# Patient Record
Sex: Female | Born: 1945 | Race: Black or African American | Hispanic: No | Marital: Single | Smoking: Former smoker
Health system: Southern US, Community
[De-identification: ages and names within clinical notes are randomized; demographics above are authoritative.]

## PROBLEM LIST (undated history)

## (undated) DIAGNOSIS — E876 Hypokalemia: Secondary | ICD-10-CM

## (undated) DIAGNOSIS — R519 Headache, unspecified: Secondary | ICD-10-CM

## (undated) DIAGNOSIS — R51 Headache: Secondary | ICD-10-CM

## (undated) DIAGNOSIS — Z22322 Carrier or suspected carrier of Methicillin resistant Staphylococcus aureus: Secondary | ICD-10-CM

## (undated) DIAGNOSIS — M8580 Other specified disorders of bone density and structure, unspecified site: Secondary | ICD-10-CM

## (undated) DIAGNOSIS — K449 Diaphragmatic hernia without obstruction or gangrene: Secondary | ICD-10-CM

## (undated) DIAGNOSIS — Z5189 Encounter for other specified aftercare: Secondary | ICD-10-CM

## (undated) DIAGNOSIS — K219 Gastro-esophageal reflux disease without esophagitis: Secondary | ICD-10-CM

## (undated) DIAGNOSIS — K635 Polyp of colon: Secondary | ICD-10-CM

## (undated) DIAGNOSIS — H539 Unspecified visual disturbance: Secondary | ICD-10-CM

## (undated) HISTORY — DX: Headache: R51

## (undated) HISTORY — DX: Unspecified visual disturbance: H53.9

## (undated) HISTORY — DX: Other specified disorders of bone density and structure, unspecified site: M85.80

## (undated) HISTORY — DX: Diaphragmatic hernia without obstruction or gangrene: K44.9

## (undated) HISTORY — PX: CATARACT EXTRACTION, BILATERAL: SHX1313

## (undated) HISTORY — DX: Hypokalemia: E87.6

## (undated) HISTORY — DX: Gastro-esophageal reflux disease without esophagitis: K21.9

## (undated) HISTORY — DX: Carrier or suspected carrier of methicillin resistant Staphylococcus aureus: Z22.322

## (undated) HISTORY — PX: OTHER SURGICAL HISTORY: SHX169

## (undated) HISTORY — DX: Polyp of colon: K63.5

## (undated) HISTORY — PX: COLONOSCOPY: SHX174

## (undated) HISTORY — DX: Encounter for other specified aftercare: Z51.89

## (undated) HISTORY — DX: Headache, unspecified: R51.9

---

## 1998-12-08 ENCOUNTER — Ambulatory Visit (HOSPITAL_COMMUNITY): Admission: RE | Admit: 1998-12-08 | Discharge: 1998-12-08 | Payer: Self-pay | Admitting: Gastroenterology

## 1999-04-25 ENCOUNTER — Other Ambulatory Visit: Admission: RE | Admit: 1999-04-25 | Discharge: 1999-04-25 | Payer: Self-pay | Admitting: *Deleted

## 2000-06-11 ENCOUNTER — Other Ambulatory Visit: Admission: RE | Admit: 2000-06-11 | Discharge: 2000-06-11 | Payer: Self-pay | Admitting: *Deleted

## 2000-11-16 ENCOUNTER — Encounter: Payer: Self-pay | Admitting: *Deleted

## 2000-11-16 ENCOUNTER — Encounter: Admission: RE | Admit: 2000-11-16 | Discharge: 2000-11-16 | Payer: Self-pay | Admitting: *Deleted

## 2001-08-21 ENCOUNTER — Other Ambulatory Visit: Admission: RE | Admit: 2001-08-21 | Discharge: 2001-08-21 | Payer: Self-pay | Admitting: *Deleted

## 2002-07-09 ENCOUNTER — Ambulatory Visit (HOSPITAL_COMMUNITY): Admission: RE | Admit: 2002-07-09 | Discharge: 2002-07-09 | Payer: Self-pay | Admitting: *Deleted

## 2002-07-09 ENCOUNTER — Encounter: Payer: Self-pay | Admitting: *Deleted

## 2002-09-29 ENCOUNTER — Other Ambulatory Visit: Admission: RE | Admit: 2002-09-29 | Discharge: 2002-09-29 | Payer: Self-pay | Admitting: *Deleted

## 2003-10-16 ENCOUNTER — Ambulatory Visit (HOSPITAL_COMMUNITY): Admission: RE | Admit: 2003-10-16 | Discharge: 2003-10-16 | Payer: Self-pay | Admitting: *Deleted

## 2003-11-05 ENCOUNTER — Other Ambulatory Visit: Admission: RE | Admit: 2003-11-05 | Discharge: 2003-11-05 | Payer: Self-pay | Admitting: *Deleted

## 2004-10-17 ENCOUNTER — Ambulatory Visit (HOSPITAL_COMMUNITY): Admission: RE | Admit: 2004-10-17 | Discharge: 2004-10-17 | Payer: Self-pay | Admitting: Obstetrics and Gynecology

## 2004-11-09 ENCOUNTER — Other Ambulatory Visit: Admission: RE | Admit: 2004-11-09 | Discharge: 2004-11-09 | Payer: Self-pay | Admitting: Obstetrics and Gynecology

## 2005-09-06 ENCOUNTER — Ambulatory Visit: Payer: Self-pay | Admitting: Internal Medicine

## 2005-09-23 ENCOUNTER — Emergency Department (HOSPITAL_COMMUNITY): Admission: EM | Admit: 2005-09-23 | Discharge: 2005-09-23 | Payer: Self-pay | Admitting: Family Medicine

## 2005-09-26 ENCOUNTER — Ambulatory Visit: Payer: Self-pay | Admitting: Internal Medicine

## 2006-01-08 ENCOUNTER — Ambulatory Visit (HOSPITAL_COMMUNITY): Admission: RE | Admit: 2006-01-08 | Discharge: 2006-01-08 | Payer: Self-pay | Admitting: Obstetrics and Gynecology

## 2006-01-18 ENCOUNTER — Other Ambulatory Visit: Admission: RE | Admit: 2006-01-18 | Discharge: 2006-01-18 | Payer: Self-pay | Admitting: Obstetrics and Gynecology

## 2006-09-14 ENCOUNTER — Emergency Department (HOSPITAL_COMMUNITY): Admission: EM | Admit: 2006-09-14 | Discharge: 2006-09-14 | Payer: Self-pay | Admitting: Emergency Medicine

## 2007-05-29 ENCOUNTER — Ambulatory Visit (HOSPITAL_COMMUNITY): Admission: RE | Admit: 2007-05-29 | Discharge: 2007-05-29 | Payer: Self-pay | Admitting: Obstetrics and Gynecology

## 2008-05-29 ENCOUNTER — Ambulatory Visit (HOSPITAL_COMMUNITY): Admission: RE | Admit: 2008-05-29 | Discharge: 2008-05-29 | Payer: Self-pay | Admitting: Obstetrics and Gynecology

## 2009-06-25 ENCOUNTER — Ambulatory Visit (HOSPITAL_COMMUNITY): Admission: RE | Admit: 2009-06-25 | Discharge: 2009-06-25 | Payer: Self-pay | Admitting: Obstetrics and Gynecology

## 2010-07-27 ENCOUNTER — Encounter (INDEPENDENT_AMBULATORY_CARE_PROVIDER_SITE_OTHER): Payer: Self-pay | Admitting: *Deleted

## 2010-10-07 ENCOUNTER — Ambulatory Visit (HOSPITAL_COMMUNITY)
Admission: RE | Admit: 2010-10-07 | Discharge: 2010-10-07 | Payer: Self-pay | Source: Home / Self Care | Admitting: Obstetrics and Gynecology

## 2010-11-18 ENCOUNTER — Encounter (INDEPENDENT_AMBULATORY_CARE_PROVIDER_SITE_OTHER): Payer: Self-pay | Admitting: *Deleted

## 2010-12-06 NOTE — Letter (Signed)
Summary: Colonoscopy Letter  [facility]  [address] [place], [place] [address]   Phone: [phone]  Fax: [phone]      July 27, 2010 MRN: [id number]   [name] [address] [place], [place]  [address]   Dear Ms. Suzanne Coleman,   According to your medical record, it is time for you to schedule a Colonoscopy. The American Cancer Society recommends this procedure as a method to detect early colon cancer. Patients with a family history of colon cancer, or a personal history of colon polyps or inflammatory bowel disease are at increased risk.  This letter has beeen generated based on the recommendations made at the time of your procedure. If you feel that in your particular situation this may no longer apply, please contact our office.  Please call our office at [phone] to schedule this appointment or to update your records at your earliest convenience.  Thank you for cooperating with [place] to provide you with the very best care possible.   Sincerely,  Hedwig Morton. Juanda Chance, M.D.  [facility] Gastroenterology Division [phone]

## 2010-12-08 NOTE — Letter (Signed)
Summary: Pre Visit Letter Revised  [facility]  [address] [place], [place] [address]   Phone: [phone]  Fax: [phone]        11/18/2010 MRN: [id number] Suzanne Coleman [address] [place], [place]  [address]             Procedure Date:  01/06/2011 @ 10:30   Recall colon-Dr. Juanda Chance   Welcome to the Gastroenterology Division at [facility].    You are scheduled to see a nurse for your pre-procedure visit on 12/23/2010 at 8:30am on the 3rd floor at [facility], [address]  We ask that you try to arrive at our office 15 minutes prior to your appointment time to allow for check-in.  Please take a minute to review the attached form.  If you answer "Yes" to one or more of the questions on the first page, we ask that you call the person listed at your earliest opportunity.  If you answer "No" to all of the questions, please complete the rest of the form and bring it to your appointment.    Your nurse visit will consist of discussing your medical and surgical history, your immediate family medical history, and your medications.   If you are unable to list all of your medications on the form, please bring the medication bottles to your appointment and we will list them.  We will need to be aware of both prescribed and over the counter drugs.  We will need to know exact dosage information as well.    Please be prepared to read and sign documents such as consent forms, a financial agreement, and acknowledgement forms.  If necessary, and with your consent, a friend or relative is welcome to sit-in on the nurse visit with you.  Please bring your insurance card so that we may make a copy of it.  If your insurance requires a referral to see a specialist, please bring your referral form from your primary care physician.  No co-pay is required for this nurse visit.     If you cannot keep your appointment, please call [phone] to cancel or reschedule prior to  your appointment date.  This allows [place] the opportunity to schedule an appointment for another patient in need of care.    Thank you for choosing [facility] for your medical needs.  We appreciate the opportunity to care for you.  Please visit [place] at our website  to learn more about our practice.  Sincerely, The Gastroenterology Division

## 2010-12-21 ENCOUNTER — Encounter (INDEPENDENT_AMBULATORY_CARE_PROVIDER_SITE_OTHER): Payer: Self-pay | Admitting: *Deleted

## 2010-12-23 ENCOUNTER — Encounter: Payer: Self-pay | Admitting: Internal Medicine

## 2010-12-28 NOTE — Miscellaneous (Signed)
Summary: [facility]  Clinical Lists Changes  Medications: Added new medication of MIRALAX   POWD (POLYETHYLENE GLYCOL 3350) As per prep  instructions. - Signed Added new medication of DULCOLAX 5 MG  TBEC (BISACODYL) Day before procedure take 2 at 3pm and 2 at 8pm. - Signed Added new medication of REGLAN 10 MG  TABS (METOCLOPRAMIDE HCL) As per prep instructions. - Signed Rx of MIRALAX   POWD (POLYETHYLENE GLYCOL 3350) As per prep  instructions.;  #255gm x 0;  Signed;  Entered by: Ezra Sites RN;  Authorized by: Hart Carwin MD;  Method used: [facility] [address] [phone]*, [address] [place], [place], [place]  [address], Ph: [phone] or [phone], Fax: [phone] Rx of DULCOLAX 5 MG  TBEC (BISACODYL) Day before procedure take 2 at 3pm and 2 at 8pm.;  #4 x 0;  Signed;  Entered by: Ezra Sites RN;  Authorized by: Hart Carwin MD;  Method used: [facility] [address] [phone]*, [address] [place], [place], [place]  [address], Ph: [phone] or [phone], Fax: [phone] Rx of REGLAN 10 MG  TABS (METOCLOPRAMIDE HCL) As per prep instructions.;  #2 x 0;  Signed;  Entered by: Ezra Sites RN;  Authorized by: Hart Carwin MD;  Method used: [facility] [address] [phone]*, [address], [place], [place], [place]  [address], Ph: [phone] or [phone], Fax: [phone] Allergies: Added new allergy or adverse reaction of CODEINE Added new allergy or adverse reaction of SULFA Observations: Added new observation of NKA: F (12/23/2010 8:43)    Prescriptions: REGLAN 10 MG  TABS (METOCLOPRAMIDE HCL) As per prep instructions.  #2 x 0   Entered by:   Ezra Sites RN   Authorized by:   Hart Carwin MD   Signed by:   Ezra Sites RN on 12/23/2010   Method used:   Electronically to        [facility] [address] [phone]* (retail)       [address]       [place], [place]  [address]       Ph:  [phone] or [phone]       Fax: [phone]   RxID:   [id number] DULCOLAX 5 MG  TBEC (BISACODYL) Day before procedure take 2 at 3pm and 2 at 8pm.  #4 x 0   Entered by:   Ezra Sites RN   Authorized by:   Hart Carwin MD   Signed by:   Ezra Sites RN on 12/23/2010   Method used:   Electronically to        [facility] [address] [phone]* (retail)       [address]       [place], [place]  [address]       Ph: [phone] or [phone]       Fax: [phone]   RxID:   [id number] MIRALAX   POWD (POLYETHYLENE GLYCOL 3350) As per prep  instructions.  #255gm x 0   Entered by:   Ezra Sites RN   Authorized by:   Hart Carwin MD   Signed by:   Ezra Sites RN on 12/23/2010   Method used:   Electronically to        [facility] [address] [phone]* (retail)       [address]  [place], [place]  [address]       Ph: [phone] or [phone]       Fax: [phone]   RxID:   [id number]

## 2010-12-28 NOTE — Letter (Signed)
Summary: [facility] Instructions  [facility]  [address] [place], [place] [address]   Phone: [phone]  Fax: [phone]       Suzanne Coleman    August 26, 1946    MRN: [id number]       Procedure Day /Date:  Friday 01/06/2011     Arrival Time: 9:30 am     Procedure Time: 10:30 am     Location of Procedure:                    _x _  [facility] (4th Floor)   PREPARATION FOR COLONOSCOPY WITH MIRALAX  Starting 5 days prior to your procedure Sunday 2/26 do not eat nuts, seeds, popcorn, corn, beans, peas,  salads, or any raw vegetables.  Do not take any fiber supplements (e.g. Metamucil, Citrucel, and Benefiber). ____________________________________________________________________________________________________   THE DAY BEFORE YOUR PROCEDURE         DATE: Thursday 3/1  1   Drink clear liquids the entire day-NO SOLID FOOD  2   Do not drink anything colored red or purple.  Avoid juices with pulp.  No orange juice.  3   Drink at least 64 oz. (8 glasses) of fluid/clear liquids during the day to prevent dehydration and help the prep work efficiently.  CLEAR LIQUIDS INCLUDE: Water Jello Ice Popsicles Tea (sugar ok, no milk/cream) Powdered fruit flavored drinks Coffee (sugar ok, no milk/cream) Gatorade Juice: apple, white grape, white cranberry  Lemonade Clear bullion, consomm, broth Carbonated beverages (any kind) Strained chicken noodle soup Hard Candy  4   Mix the entire bottle of Miralax with 64 oz. of Gatorade/Powerade in the morning and put in the refrigerator to chill.  5   At 3:00 pm take 2 Dulcolax/Bisacodyl tablets.  6   At 4:30 pm take one Reglan/Metoclopramide tablet.  7  Starting at 5:00 pm drink one 8 oz glass of the Miralax mixture every 15-20 minutes until you have finished drinking the entire 64 oz.  You should finish drinking prep around 7:30 or 8:00 pm.  8   If you are nauseated, you may take the 2nd Reglan/Metoclopramide tablet  at 6:30 pm.        9    At 8:00 pm take 2 more DULCOLAX/Bisacodyl tablets.     THE DAY OF YOUR PROCEDURE      DATE:  Friday 3/2 You may drink clear liquids until 8:30 am  (2 HOURS BEFORE PROCEDURE).   MEDICATION INSTRUCTIONS  Unless otherwise instructed, you should take regular prescription medications with a small sip of water as early as possible the morning of your procedure.        OTHER INSTRUCTIONS  You will need a responsible adult at least 63 years of age to accompany you and drive you home.   This person must remain in the waiting room during your procedure.  Wear loose fitting clothing that is easily removed.  Leave jewelry and other valuables at home.  However, you may wish to bring a book to read or an iPod/MP3 player to listen to music as you wait for your procedure to start.  Remove all body piercing jewelry and leave at home.  Total time from sign-in until discharge is approximately 2-3 hours.  You should go home directly after your procedure and rest.  You can resume normal activities the day after your procedure.  The day of your procedure you should not:   Drive   Make legal decisions  Operate machinery   Drink alcohol   Return to work  You will receive specific instructions about eating, activities and medications before you leave.   The above instructions have been reviewed and explained to me by   Ezra Sites RN  December 23, 2010 9:04 AM    I fully understand and can verbalize these instructions _____________________________ Date _______

## 2011-01-06 ENCOUNTER — Encounter: Payer: Self-pay | Admitting: Internal Medicine

## 2011-01-06 ENCOUNTER — Other Ambulatory Visit: Payer: 59 | Admitting: Internal Medicine

## 2011-01-12 NOTE — Procedures (Addendum)
Summary: Colonoscopy  Patient: Suzanne Coleman Note: All result statuses are Final unless otherwise noted.  Tests: (1) Colonoscopy (COL)   COL Colonoscopy           DONE     [facility]     [address]     [place], [place]  [address]          COLONOSCOPY PROCEDURE REPORT          PATIENT:  Suzanne, Coleman  MR#:  [id number]     BIRTHDATE:  1946-07-30, 64 yrs. old  GENDER:  female     ENDOSCOPIST:  Hedwig Morton. Juanda Chance, MD     REF. BY:  Harold Hedge, M.D.     PROCEDURE DATE:  01/06/2011     PROCEDURE:  Colonoscopy 62952     ASA CLASS:  Class I     INDICATIONS:  family history of colon cancer father with colon     cancer, personal hx of colon polyps ( no documentation)     Last colon 09/2005     MEDICATIONS:   Versed 8 mg, Fentanyl 75          DESCRIPTION OF PROCEDURE:   After the risks benefits and     alternatives of the procedure were thoroughly explained, informed     consent was obtained.  Digital rectal exam was performed and     revealed no rectal masses.   The LB [id number]-[id number] endoscope     was introduced through the anus and advanced to the cecum, which     was identified by both the appendix and ileocecal valve, without     limitations.  The quality of the prep was good, using MiraLax.     The instrument was then slowly withdrawn as the colon was fully     examined.     <<PROCEDUREIMAGES>>          FINDINGS:  No polyps or cancers were seen (see image1 and image3).     Retroflexed views in the rectum revealed no abnormalities.    The     scope was then withdrawn from the patient and the procedure     completed.          COMPLICATIONS:  None     ENDOSCOPIC IMPRESSION:     1) No polyps or cancers     2) Normal colonoscopy     RECOMMENDATIONS:     1) high fiber diet     REPEAT EXAM:  In 5 year(s) for.          ______________________________     Hedwig Morton. Juanda Chance, MD          CC:          n.     eSIGNED:   Hedwig Morton. Yena Tisby at 01/06/2011 11:32 AM        Jiles Harold, [phone]  Note: An exclamation mark (!) indicates a result that was not dispersed into the flowsheet. Document Creation Date: 01/06/2011 11:32 AM _______________________________________________________________________  (1) Order result status: Final Collection or observation date-time: 01/06/2011 11:21 Requested date-time:  Receipt date-time:  Reported date-time:  Referring Physician:   Ordering Physician: Lina Sar [phone]) Specimen Source:  Source: Launa Grill Order Number: [id number] Lab site:   Appended Document: Colonoscopy    Clinical Lists Changes  Observations: Added new observation of COLONNXTDUE: 01/2016 (01/06/2011 12:19)

## 2011-11-08 ENCOUNTER — Other Ambulatory Visit (HOSPITAL_COMMUNITY): Payer: Self-pay | Admitting: Obstetrics and Gynecology

## 2011-11-08 DIAGNOSIS — Z1231 Encounter for screening mammogram for malignant neoplasm of breast: Secondary | ICD-10-CM

## 2011-11-09 ENCOUNTER — Ambulatory Visit (HOSPITAL_COMMUNITY)
Admission: RE | Admit: 2011-11-09 | Discharge: 2011-11-09 | Disposition: A | Discharge status: Home / Self Care | Payer: 59 | Source: Ambulatory Visit | Attending: Obstetrics and Gynecology | Admitting: Obstetrics and Gynecology

## 2011-11-09 DIAGNOSIS — Z1231 Encounter for screening mammogram for malignant neoplasm of breast: Secondary | ICD-10-CM | POA: Insufficient documentation

## 2012-11-15 ENCOUNTER — Other Ambulatory Visit (HOSPITAL_COMMUNITY): Payer: Self-pay | Admitting: Obstetrics and Gynecology

## 2012-11-15 DIAGNOSIS — Z1231 Encounter for screening mammogram for malignant neoplasm of breast: Secondary | ICD-10-CM

## 2012-11-22 ENCOUNTER — Ambulatory Visit (HOSPITAL_COMMUNITY)
Admission: RE | Admit: 2012-11-22 | Discharge: 2012-11-22 | Disposition: A | Discharge status: Home / Self Care | Payer: 59 | Source: Ambulatory Visit | Attending: Obstetrics and Gynecology | Admitting: Obstetrics and Gynecology

## 2012-11-22 DIAGNOSIS — Z1231 Encounter for screening mammogram for malignant neoplasm of breast: Secondary | ICD-10-CM

## 2013-10-22 ENCOUNTER — Other Ambulatory Visit (HOSPITAL_COMMUNITY): Payer: Self-pay | Admitting: Obstetrics and Gynecology

## 2013-10-22 DIAGNOSIS — Z1231 Encounter for screening mammogram for malignant neoplasm of breast: Secondary | ICD-10-CM

## 2013-10-27 DIAGNOSIS — H2510 Age-related nuclear cataract, unspecified eye: Secondary | ICD-10-CM | POA: Diagnosis not present

## 2013-12-02 ENCOUNTER — Ambulatory Visit (HOSPITAL_COMMUNITY)
Admission: RE | Admit: 2013-12-02 | Discharge: 2013-12-02 | Disposition: A | Discharge status: Home / Self Care | Payer: Medicare Other | Source: Ambulatory Visit | Attending: Obstetrics and Gynecology | Admitting: Obstetrics and Gynecology

## 2013-12-02 ENCOUNTER — Ambulatory Visit (HOSPITAL_COMMUNITY): Payer: 59

## 2013-12-02 DIAGNOSIS — Z1231 Encounter for screening mammogram for malignant neoplasm of breast: Secondary | ICD-10-CM | POA: Insufficient documentation

## 2014-04-03 DIAGNOSIS — Z0000 Encounter for general adult medical examination without abnormal findings: Secondary | ICD-10-CM | POA: Diagnosis not present

## 2014-04-03 DIAGNOSIS — E559 Vitamin D deficiency, unspecified: Secondary | ICD-10-CM | POA: Diagnosis not present

## 2014-04-03 DIAGNOSIS — H6120 Impacted cerumen, unspecified ear: Secondary | ICD-10-CM | POA: Diagnosis not present

## 2014-04-03 DIAGNOSIS — Z800 Family history of malignant neoplasm of digestive organs: Secondary | ICD-10-CM | POA: Diagnosis not present

## 2014-04-03 DIAGNOSIS — E876 Hypokalemia: Secondary | ICD-10-CM | POA: Diagnosis not present

## 2014-05-15 DIAGNOSIS — Z23 Encounter for immunization: Secondary | ICD-10-CM | POA: Diagnosis not present

## 2014-11-13 DIAGNOSIS — H6983 Other specified disorders of Eustachian tube, bilateral: Secondary | ICD-10-CM | POA: Diagnosis not present

## 2015-04-27 ENCOUNTER — Other Ambulatory Visit (HOSPITAL_COMMUNITY): Payer: Self-pay | Admitting: Internal Medicine

## 2015-04-27 DIAGNOSIS — Z1231 Encounter for screening mammogram for malignant neoplasm of breast: Secondary | ICD-10-CM

## 2015-05-05 ENCOUNTER — Ambulatory Visit (HOSPITAL_COMMUNITY)
Admission: RE | Admit: 2015-05-05 | Discharge: 2015-05-05 | Disposition: A | Discharge status: Home / Self Care | Payer: Medicare Other | Source: Ambulatory Visit | Attending: Internal Medicine | Admitting: Internal Medicine

## 2015-05-05 DIAGNOSIS — Z1231 Encounter for screening mammogram for malignant neoplasm of breast: Secondary | ICD-10-CM | POA: Diagnosis not present

## 2015-05-05 DIAGNOSIS — E559 Vitamin D deficiency, unspecified: Secondary | ICD-10-CM | POA: Diagnosis not present

## 2015-05-05 DIAGNOSIS — E876 Hypokalemia: Secondary | ICD-10-CM | POA: Diagnosis not present

## 2015-05-05 DIAGNOSIS — Z0001 Encounter for general adult medical examination with abnormal findings: Secondary | ICD-10-CM | POA: Diagnosis not present

## 2015-05-11 DIAGNOSIS — Z800 Family history of malignant neoplasm of digestive organs: Secondary | ICD-10-CM | POA: Diagnosis not present

## 2015-05-11 DIAGNOSIS — R51 Headache: Secondary | ICD-10-CM | POA: Diagnosis not present

## 2015-05-11 DIAGNOSIS — M654 Radial styloid tenosynovitis [de Quervain]: Secondary | ICD-10-CM | POA: Diagnosis not present

## 2015-05-11 DIAGNOSIS — R312 Other microscopic hematuria: Secondary | ICD-10-CM | POA: Diagnosis not present

## 2015-05-13 DIAGNOSIS — N390 Urinary tract infection, site not specified: Secondary | ICD-10-CM | POA: Diagnosis not present

## 2015-05-13 DIAGNOSIS — R312 Other microscopic hematuria: Secondary | ICD-10-CM | POA: Diagnosis not present

## 2015-05-13 DIAGNOSIS — M654 Radial styloid tenosynovitis [de Quervain]: Secondary | ICD-10-CM | POA: Diagnosis not present

## 2015-05-18 DIAGNOSIS — M810 Age-related osteoporosis without current pathological fracture: Secondary | ICD-10-CM | POA: Diagnosis not present

## 2015-06-09 DIAGNOSIS — Z6824 Body mass index (BMI) 24.0-24.9, adult: Secondary | ICD-10-CM | POA: Diagnosis not present

## 2015-06-09 DIAGNOSIS — Z124 Encounter for screening for malignant neoplasm of cervix: Secondary | ICD-10-CM | POA: Diagnosis not present

## 2015-06-24 DIAGNOSIS — M8580 Other specified disorders of bone density and structure, unspecified site: Secondary | ICD-10-CM | POA: Diagnosis not present

## 2016-01-21 ENCOUNTER — Encounter: Payer: Self-pay | Admitting: Gastroenterology

## 2016-04-06 ENCOUNTER — Encounter: Payer: Self-pay | Admitting: Gastroenterology

## 2016-05-24 ENCOUNTER — Ambulatory Visit: Payer: Medicare Other

## 2016-05-24 VITALS — Ht 62.5 in | Wt 144.2 lb

## 2016-05-24 DIAGNOSIS — Z800 Family history of malignant neoplasm of digestive organs: Secondary | ICD-10-CM

## 2016-05-24 MED ORDER — SUPREP BOWEL PREP KIT 17.5-3.13-1.6 GM/177ML PO SOLN: 1.0000 | Freq: Once | ORAL | Status: DC

## 2016-05-24 NOTE — Progress Notes (Signed)
No allergies to eggs or soy No home oxygen No diet meds No past problems with anesthesia  Has email and internet; declined emmi 

## 2016-05-31 ENCOUNTER — Telehealth: Payer: Self-pay | Admitting: Gastroenterology

## 2016-06-01 NOTE — Telephone Encounter (Signed)
Left message that I would put a Suprep sample up front for her to pick up.

## 2016-06-06 ENCOUNTER — Other Ambulatory Visit: Payer: Self-pay | Admitting: Internal Medicine

## 2016-06-06 DIAGNOSIS — Z1231 Encounter for screening mammogram for malignant neoplasm of breast: Secondary | ICD-10-CM

## 2016-06-07 ENCOUNTER — Encounter: Payer: Self-pay | Admitting: Gastroenterology

## 2016-06-07 ENCOUNTER — Ambulatory Visit (AMBULATORY_SURGERY_CENTER): Payer: Medicare Other | Admitting: Gastroenterology

## 2016-06-07 VITALS — BP 124/64 | HR 72 | Temp 98.2°F | Resp 6 | Ht 62.0 in | Wt 144.0 lb

## 2016-06-07 DIAGNOSIS — Z800 Family history of malignant neoplasm of digestive organs: Secondary | ICD-10-CM

## 2016-06-07 DIAGNOSIS — Z1211 Encounter for screening for malignant neoplasm of colon: Secondary | ICD-10-CM

## 2016-06-07 MED ORDER — SODIUM CHLORIDE 0.9 % IV SOLN: 500.0000 mL | INTRAVENOUS | Status: DC

## 2016-06-07 NOTE — Patient Instructions (Signed)

## 2016-06-07 NOTE — Op Note (Signed)
[facility] Patient Name: Suzanne Coleman Procedure Date: 06/07/2016 8:44 AM MRN: [id number] Endoscopist: Napoleon Form , MD Age: 70 Referring MD:  Date of Birth: 27-Oct-1946 Gender: Female Account #: [id number] Procedure:                Colonoscopy Indications:              Screening in patient at increased risk: Family                            history of 1st-degree relative with colorectal                            cancer, Colon cancer screening in patient at                            increased risk: Family history of colorectal cancer                            in multiple 2nd degree relatives, Last colonoscopy:                            2012 Medicines:                Monitored Anesthesia Care Procedure:                Pre-Anesthesia Assessment:                           - Prior to the procedure, a History and Physical                            was performed, and patient medications and                            allergies were reviewed. The patient's tolerance of                            previous anesthesia was also reviewed. The risks                            and benefits of the procedure and the sedation                            options and risks were discussed with the patient.                            All questions were answered, and informed consent                            was obtained. Prior Anticoagulants: The patient has                            taken no previous anticoagulant or antiplatelet  agents. ASA Grade Assessment: II - A patient with                            mild systemic disease. After reviewing the risks                            and benefits, the patient was deemed in                            satisfactory condition to undergo the procedure.                           After obtaining informed consent, the colonoscope                            was passed under direct vision. Throughout the                  procedure, the patient's blood pressure, pulse, and                            oxygen saturations were monitored continuously. The                            Model CF-[id number]) scope was introduced                            through the anus and advanced to the the terminal                            ileum, with identification of the appendiceal                            orifice and IC valve. The colonoscopy was performed                            without difficulty. The patient tolerated the                            procedure well. The quality of the bowel                            preparation was good. The terminal ileum, ileocecal                            valve, appendiceal orifice, and rectum were                            photographed. Scope In: 9:30:05 AM Scope Out: 9:41:06 AM Scope Withdrawal Time: 0 hours 6 minutes 49 seconds  Total Procedure Duration: 0 hours 11 minutes 1 second  Findings:                 The perianal and digital rectal examinations were  normal.                           Scattered small-mouthed diverticula were found in                            the sigmoid colon and descending colon.                           Non-bleeding internal hemorrhoids were found during                            retroflexion. The hemorrhoids were small. Complications:            No immediate complications. Estimated Blood Loss:     Estimated blood loss: none. Impression:               - Diverticulosis in the sigmoid colon and in the                            descending colon.                           - Non-bleeding internal hemorrhoids.                           - No specimens collected. Recommendation:           - Patient has a contact number available for                            emergencies. The signs and symptoms of potential                            delayed complications were discussed with the                             patient. Return to normal activities tomorrow.                            Written discharge instructions were provided to the                            patient.                           - Resume previous diet.                           - Continue present medications.                           - Repeat colonoscopy in 5 years for surveillance.                           - Return to GI clinic PRN. Napoleon Form, MD 06/07/2016 9:48:47 AM This report has been signed electronically.

## 2016-06-07 NOTE — Progress Notes (Signed)
To recovery, report to Scott, RN, VSS 

## 2016-06-08 ENCOUNTER — Telehealth: Payer: Self-pay

## 2016-06-08 NOTE — Telephone Encounter (Signed)
  Follow up Call-  Call back number 06/07/2016  Post procedure Call Back phone  # [phone]  Permission to leave phone message Yes  Some recent data might be hidden     Patient questions:  Do you have a fever, pain , or abdominal swelling? No. Pain Score  0 *  Have you tolerated food without any problems? Yes.    Have you been able to return to your normal activities? Yes.    Do you have any questions about your discharge instructions: Diet   No. Medications  No. Follow up visit  No.  Do you have questions or concerns about your Care? No.  Actions: * If pain score is 4 or above: No action needed, pain <4.

## 2016-06-14 DIAGNOSIS — E876 Hypokalemia: Secondary | ICD-10-CM | POA: Diagnosis not present

## 2016-06-14 DIAGNOSIS — M8580 Other specified disorders of bone density and structure, unspecified site: Secondary | ICD-10-CM | POA: Diagnosis not present

## 2016-06-14 DIAGNOSIS — N858 Other specified noninflammatory disorders of uterus: Secondary | ICD-10-CM | POA: Diagnosis not present

## 2016-06-14 DIAGNOSIS — Z124 Encounter for screening for malignant neoplasm of cervix: Secondary | ICD-10-CM | POA: Diagnosis not present

## 2016-06-14 DIAGNOSIS — Z8639 Personal history of other endocrine, nutritional and metabolic disease: Secondary | ICD-10-CM | POA: Diagnosis not present

## 2016-06-14 DIAGNOSIS — E559 Vitamin D deficiency, unspecified: Secondary | ICD-10-CM | POA: Diagnosis not present

## 2016-06-14 DIAGNOSIS — Z6825 Body mass index (BMI) 25.0-25.9, adult: Secondary | ICD-10-CM | POA: Diagnosis not present

## 2016-06-15 ENCOUNTER — Ambulatory Visit
Admission: RE | Admit: 2016-06-15 | Discharge: 2016-06-15 | Disposition: A | Discharge status: Home / Self Care | Payer: Medicare Other | Source: Ambulatory Visit | Attending: Internal Medicine | Admitting: Internal Medicine

## 2016-06-15 DIAGNOSIS — Z1231 Encounter for screening mammogram for malignant neoplasm of breast: Secondary | ICD-10-CM

## 2016-06-22 DIAGNOSIS — Z0000 Encounter for general adult medical examination without abnormal findings: Secondary | ICD-10-CM | POA: Diagnosis not present

## 2016-06-23 DIAGNOSIS — R140 Abdominal distension (gaseous): Secondary | ICD-10-CM | POA: Diagnosis not present

## 2016-07-07 ENCOUNTER — Other Ambulatory Visit: Payer: Self-pay

## 2016-08-02 DIAGNOSIS — E876 Hypokalemia: Secondary | ICD-10-CM | POA: Diagnosis not present

## 2016-08-02 DIAGNOSIS — Z8639 Personal history of other endocrine, nutritional and metabolic disease: Secondary | ICD-10-CM | POA: Diagnosis not present

## 2016-08-02 DIAGNOSIS — M8580 Other specified disorders of bone density and structure, unspecified site: Secondary | ICD-10-CM | POA: Diagnosis not present

## 2016-08-02 DIAGNOSIS — Z23 Encounter for immunization: Secondary | ICD-10-CM | POA: Diagnosis not present

## 2016-08-02 DIAGNOSIS — M654 Radial styloid tenosynovitis [de Quervain]: Secondary | ICD-10-CM | POA: Diagnosis not present

## 2016-08-09 DIAGNOSIS — E876 Hypokalemia: Secondary | ICD-10-CM | POA: Diagnosis not present

## 2017-05-18 ENCOUNTER — Other Ambulatory Visit: Payer: Self-pay | Admitting: Internal Medicine

## 2017-05-18 DIAGNOSIS — Z1231 Encounter for screening mammogram for malignant neoplasm of breast: Secondary | ICD-10-CM

## 2017-05-28 DIAGNOSIS — M859 Disorder of bone density and structure, unspecified: Secondary | ICD-10-CM | POA: Diagnosis not present

## 2017-05-28 DIAGNOSIS — M8580 Other specified disorders of bone density and structure, unspecified site: Secondary | ICD-10-CM | POA: Diagnosis not present

## 2017-06-18 ENCOUNTER — Ambulatory Visit
Admission: RE | Admit: 2017-06-18 | Discharge: 2017-06-18 | Disposition: A | Discharge status: Home / Self Care | Payer: PPO | Source: Ambulatory Visit | Attending: Internal Medicine | Admitting: Internal Medicine

## 2017-06-18 DIAGNOSIS — Z1231 Encounter for screening mammogram for malignant neoplasm of breast: Secondary | ICD-10-CM

## 2017-07-12 DIAGNOSIS — Z01419 Encounter for gynecological examination (general) (routine) without abnormal findings: Secondary | ICD-10-CM | POA: Diagnosis not present

## 2017-07-12 DIAGNOSIS — Z6826 Body mass index (BMI) 26.0-26.9, adult: Secondary | ICD-10-CM | POA: Diagnosis not present

## 2017-08-06 DIAGNOSIS — Z23 Encounter for immunization: Secondary | ICD-10-CM | POA: Diagnosis not present

## 2017-08-06 DIAGNOSIS — E876 Hypokalemia: Secondary | ICD-10-CM | POA: Diagnosis not present

## 2017-08-06 DIAGNOSIS — N390 Urinary tract infection, site not specified: Secondary | ICD-10-CM | POA: Diagnosis not present

## 2017-08-06 DIAGNOSIS — E559 Vitamin D deficiency, unspecified: Secondary | ICD-10-CM | POA: Diagnosis not present

## 2017-08-06 DIAGNOSIS — M8580 Other specified disorders of bone density and structure, unspecified site: Secondary | ICD-10-CM | POA: Diagnosis not present

## 2017-08-06 DIAGNOSIS — Z0000 Encounter for general adult medical examination without abnormal findings: Secondary | ICD-10-CM | POA: Diagnosis not present

## 2017-08-09 DIAGNOSIS — J302 Other seasonal allergic rhinitis: Secondary | ICD-10-CM | POA: Diagnosis not present

## 2017-08-09 DIAGNOSIS — M8580 Other specified disorders of bone density and structure, unspecified site: Secondary | ICD-10-CM | POA: Diagnosis not present

## 2017-08-09 DIAGNOSIS — H25013 Cortical age-related cataract, bilateral: Secondary | ICD-10-CM | POA: Diagnosis not present

## 2017-08-09 DIAGNOSIS — Z882 Allergy status to sulfonamides status: Secondary | ICD-10-CM | POA: Diagnosis not present

## 2017-08-09 DIAGNOSIS — E876 Hypokalemia: Secondary | ICD-10-CM | POA: Diagnosis not present

## 2017-08-09 DIAGNOSIS — Z800 Family history of malignant neoplasm of digestive organs: Secondary | ICD-10-CM | POA: Diagnosis not present

## 2017-08-09 DIAGNOSIS — Z8639 Personal history of other endocrine, nutritional and metabolic disease: Secondary | ICD-10-CM | POA: Diagnosis not present

## 2017-08-09 DIAGNOSIS — Z0000 Encounter for general adult medical examination without abnormal findings: Secondary | ICD-10-CM | POA: Diagnosis not present

## 2017-08-09 DIAGNOSIS — B353 Tinea pedis: Secondary | ICD-10-CM | POA: Diagnosis not present

## 2017-08-09 DIAGNOSIS — M654 Radial styloid tenosynovitis [de Quervain]: Secondary | ICD-10-CM | POA: Diagnosis not present

## 2017-10-01 DIAGNOSIS — R51 Headache: Secondary | ICD-10-CM | POA: Diagnosis not present

## 2017-10-01 DIAGNOSIS — E876 Hypokalemia: Secondary | ICD-10-CM | POA: Diagnosis not present

## 2017-10-08 ENCOUNTER — Encounter: Payer: Self-pay | Admitting: Neurology

## 2017-10-08 ENCOUNTER — Ambulatory Visit (INDEPENDENT_AMBULATORY_CARE_PROVIDER_SITE_OTHER): Payer: PPO | Admitting: Neurology

## 2017-10-08 ENCOUNTER — Telehealth: Payer: Self-pay | Admitting: Neurology

## 2017-10-08 VITALS — BP 138/94 | HR 88 | Ht 62.5 in | Wt 144.0 lb

## 2017-10-08 DIAGNOSIS — R519 Headache, unspecified: Secondary | ICD-10-CM | POA: Insufficient documentation

## 2017-10-08 DIAGNOSIS — R51 Headache: Secondary | ICD-10-CM | POA: Diagnosis not present

## 2017-10-08 MED ORDER — ALPRAZOLAM 1 MG PO TABS: 1.0000 mg | ORAL_TABLET | ORAL | 0 refills | Status: DC | PRN

## 2017-10-08 NOTE — Progress Notes (Addendum)
PATIENT: Suzanne Coleman DOB: 1946/02/14  Chief Complaint  Patient presents with  . Headache    Reports intermittent, right-sided headaches that feels like a needle quickly being stuck in her head.  She also experienced a numbness sensation in her right forehead and right scalp during her last episode.  Suzanne Coleman PCP    Deland Pretty, MD     HISTORICAL  Suzanne Coleman is a 71 year old female, seen in refer by her primary care doctor Deland Pretty for evaluation of headache, initial evaluation was on October 08, 2017.  She was previously healthy, deny a history of headaches, her older sister died of brain tumor when she was young.  At the end of 2016, when she was taking care of aging parents, she had excessive stress, she noticed intermittent right parietal region needle prick pain, lasting for a few seconds, it was transient,  It reoccurred again in the middle of 2017, when she was again going through excessive stress.  The headaches only last for a few days, intermittent.  She had the worst appearance of right side needle prick headache on November 13,, 14, 15 2018, transient needle prick pain, but intermittent lasting for 3 days, she also had right skull sensitivity, soreness afterwards,  She denies visual change, no chewing difficulty, no diffuse body achy pain, she does not have headaches now.  She was seen by her primary care on October 01, 2017, laboratory evaluation showed normal CMP, creatinine 0.9 3, ESR 7, normal CBC, hemoglobin of 12.1, LDL 97, total cholesterol 165, vitamin D 58,  REVIEW OF SYSTEMS: Full 14 system review of systems performed and notable only for headache  ALLERGIES: Allergies  Allergen Reactions  . Codeine     REACTION: hives, vomiting  . Sulfonamide Derivatives     REACTION: hives, vomiting    HOME MEDICATIONS: Current Outpatient Medications  Medication Sig Dispense Refill  . CALCIUM PO Take 500 mg by mouth daily.    . Cholecalciferol (VITAMIN  D3 PO) Take 500 Units by mouth daily.    . Multiple Vitamin (MULTIVITAMIN) tablet Take 1 tablet by mouth daily.    . vitamin B-12 (CYANOCOBALAMIN) 500 MCG tablet Take 500 mcg by mouth daily.     No current facility-administered medications for this visit.     PAST MEDICAL HISTORY: Past Medical History:  Diagnosis Date  . Headache   . MRSA carrier   . Osteopenia     PAST SURGICAL HISTORY: Past Surgical History:  Procedure Laterality Date  . COLONOSCOPY    . SVD     x2    FAMILY HISTORY: Family History  Problem Relation Age of Onset  . Colon cancer Father   . Lung cancer Father   . Dementia Father   . Dementia Mother   . Cancer Mother        unsure of type    SOCIAL HISTORY:  Social History   Socioeconomic History  . Marital status: Single    Spouse name: Not on file  . Number of children: 2  . Years of education: 16  . Highest education level: Bachelor's degree (e.g., BA, AB, BS)  Social Needs  . Financial resource strain: Not on file  . Food insecurity - worry: Not on file  . Food insecurity - inability: Not on file  . Transportation needs - medical: Not on file  . Transportation needs - non-medical: Not on file  Occupational History  . Occupation: Retired  Tobacco Use  .  Smoking status: Former Research scientist (life sciences)  . Smokeless tobacco: Never Used  Substance and Sexual Activity  . Alcohol use: No    Alcohol/week: 0.0 oz    Frequency: Never  . Drug use: No  . Sexual activity: Not on file  Other Topics Concern  . Not on file  Social History Narrative   Lives with her daughter.   Right-handed.   3 cups caffeine per month.     PHYSICAL EXAM   Vitals:   10/08/17 0829  BP: (!) 138/94  Pulse: 88  Weight: 144 lb (65.3 kg)  Height: 5' 2.5" (1.588 m)    Not recorded      Body mass index is 25.92 kg/m.  PHYSICAL EXAMNIATION:  Gen: NAD, conversant, well nourised, obese, well groomed                     Cardiovascular: Regular rate rhythm, no peripheral  edema, warm, nontender. Eyes: Conjunctivae clear without exudates or hemorrhage Neck: Supple, no carotid bruits. Pulmonary: Clear to auscultation bilaterally   NEUROLOGICAL EXAM:  MENTAL STATUS: Speech:    Speech is normal; fluent and spontaneous with normal comprehension.  Cognition:     Orientation to time, place and person     Normal recent and remote memory     Normal Attention span and concentration     Normal Language, naming, repeating,spontaneous speech     Fund of knowledge   CRANIAL NERVES: CN II: Visual fields are full to confrontation. Fundoscopic exam is normal with sharp discs and no vascular changes. Pupils are round equal and briskly reactive to light. CN III, IV, VI: extraocular movement are normal. No ptosis. CN V: Facial sensation is intact to pinprick in all 3 divisions bilaterally. Corneal responses are intact.  CN VII: Face is symmetric with normal eye closure and smile. CN VIII: Hearing is normal to rubbing fingers CN IX, X: Palate elevates symmetrically. Phonation is normal. CN XI: Head turning and shoulder shrug are intact CN XII: Tongue is midline with normal movements and no atrophy.  MOTOR: There is no pronator drift of out-stretched arms. Muscle bulk and tone are normal. Muscle strength is normal.  REFLEXES: Reflexes are 2+ and symmetric at the biceps, triceps, knees, and ankles. Plantar responses are flexor.  SENSORY: Intact to light touch, pinprick, positional sensation and vibratory sensation are intact in fingers and toes.  COORDINATION: Rapid alternating movements and fine finger movements are intact. There is no dysmetria on finger-to-nose and heel-knee-shin.    GAIT/STANCE: Posture is normal. Gait is steady with normal steps, base, arm swing, and turning. Heel and toe walking are normal. Tandem gait is normal.  Romberg is absent.   DIAGNOSTIC DATA (LABS, IMAGING, TESTING) - I reviewed patient records, labs, notes, testing and imaging  myself where available.   ASSESSMENT AND PLAN  SRAVYA GRISSOM is a 71 y.o. female   New onset right-sided headaches  Family history of brain tumor  Get laboratory evaluation from her primary care physician  MRI of brain to rule out structural lesion   Marcial Pacas, M.D. Ph.D.  [facility] [address], [place], [place] [address] Ph: [phone] Fax: [phone]  CC: Deland Pretty, MD

## 2017-10-08 NOTE — Telephone Encounter (Signed)
Called patient's PCP and spoke w/ Misty StanleyLisa in medical records.  She will fax over patient's most recent lab results.

## 2017-10-08 NOTE — Telephone Encounter (Signed)
Get laboratory evaluation from her primary care physician Dr. Deland Pretty from [facility]  Make sure ESR, CRP, TSH was checked.

## 2017-10-08 NOTE — Telephone Encounter (Signed)
Records received via fax and provided to Dr. Terrace ArabiaYan for review.

## 2017-10-12 DIAGNOSIS — H25013 Cortical age-related cataract, bilateral: Secondary | ICD-10-CM | POA: Diagnosis not present

## 2017-10-12 DIAGNOSIS — H524 Presbyopia: Secondary | ICD-10-CM | POA: Diagnosis not present

## 2017-10-12 DIAGNOSIS — H5203 Hypermetropia, bilateral: Secondary | ICD-10-CM | POA: Diagnosis not present

## 2017-10-12 DIAGNOSIS — H2513 Age-related nuclear cataract, bilateral: Secondary | ICD-10-CM | POA: Diagnosis not present

## 2017-10-20 ENCOUNTER — Ambulatory Visit
Admission: RE | Admit: 2017-10-20 | Discharge: 2017-10-20 | Disposition: A | Discharge status: Home / Self Care | Payer: PPO | Source: Ambulatory Visit | Attending: Neurology | Admitting: Neurology

## 2017-10-20 DIAGNOSIS — R51 Headache: Secondary | ICD-10-CM | POA: Diagnosis not present

## 2017-10-20 DIAGNOSIS — R519 Headache, unspecified: Secondary | ICD-10-CM

## 2017-10-22 ENCOUNTER — Telehealth: Payer: Self-pay | Admitting: Neurology

## 2017-10-22 NOTE — Telephone Encounter (Signed)
Please call patient, MRI of brain showed extensive supratentorium small vessel disease, no acute abnormalities, please give her a follow up visit.  IMPRESSION:  This MRI of the brain without contrast shows the following: 1.    Extensive T2/FLAIR hyperintense foci in the deep and subcortical white matter with confluencies in the periatrial white matter consistent with advanced chronic microvascular ischemic change, more than expected for age. 2.    There are no acute findings.

## 2017-10-22 NOTE — Telephone Encounter (Signed)
She is aware of MRI results and has scheduled a follow up to further discuss with Dr. Terrace ArabiaYan.

## 2017-11-15 DIAGNOSIS — R51 Headache: Secondary | ICD-10-CM | POA: Diagnosis not present

## 2017-11-20 ENCOUNTER — Ambulatory Visit: Payer: Self-pay | Admitting: Neurology

## 2017-11-30 ENCOUNTER — Encounter: Payer: Self-pay | Admitting: Physician Assistant

## 2017-11-30 ENCOUNTER — Ambulatory Visit (INDEPENDENT_AMBULATORY_CARE_PROVIDER_SITE_OTHER): Payer: PPO | Admitting: Physician Assistant

## 2017-11-30 ENCOUNTER — Other Ambulatory Visit: Payer: Self-pay

## 2017-11-30 VITALS — BP 109/74 | HR 76 | Resp 18 | Ht 62.5 in | Wt 144.0 lb

## 2017-11-30 DIAGNOSIS — J019 Acute sinusitis, unspecified: Secondary | ICD-10-CM | POA: Diagnosis not present

## 2017-11-30 DIAGNOSIS — M654 Radial styloid tenosynovitis [de Quervain]: Secondary | ICD-10-CM | POA: Diagnosis not present

## 2017-11-30 DIAGNOSIS — B9689 Other specified bacterial agents as the cause of diseases classified elsewhere: Secondary | ICD-10-CM

## 2017-11-30 DIAGNOSIS — E876 Hypokalemia: Secondary | ICD-10-CM | POA: Diagnosis not present

## 2017-11-30 MED ORDER — DOXYCYCLINE HYCLATE 100 MG PO CAPS: 100.0000 mg | ORAL_CAPSULE | Freq: Two times a day (BID) | ORAL | 0 refills | Status: AC

## 2017-11-30 NOTE — Progress Notes (Signed)
11/30/2017 3:45 PM   DOB: 11/25/1945 / MRN: [id number]  SUBJECTIVE:   Suzanne Coleman is a 72 y.o. female presenting for patient here for 2 weeks of sinus congestion and pressure.  States that started off as a cold however the initial symptoms of sore throat and rhinorrhea resolved about 4 days into the illness.  She is tried several over-the-counter medications without relief.  She feels she is getting worse.  Denies fever.  Complains of mild pain in the upper teeth as well as a mild frontal sinus headache.  She is allergic to codeine and sulfonamide derivatives.   She  has a past medical history of Headache, MRSA carrier, and Osteopenia.    She  reports that she has quit smoking. she has never used smokeless tobacco. She reports that she does not drink alcohol or use drugs. She  has no sexual activity history on file. The patient  has a past surgical history that includes Colonoscopy and SVD.  Her family history includes Cancer in her mother; Colon cancer in her father; Dementia in her father and mother; Lung cancer in her father.  Review of Systems  Neurological: Negative for dizziness and focal weakness.    The problem list and medications were reviewed and updated by myself where necessary and exist elsewhere in the encounter.   OBJECTIVE:  BP 109/74 (BP Location: Left Arm, Patient Position: Sitting, Cuff Size: Normal)   Pulse 76   Resp 18   Ht 5' 2.5" (1.588 m)   Wt 144 lb (65.3 kg)   SpO2 97%   BMI 25.92 kg/m   Physical Exam  Constitutional: She is active.  Non-toxic appearance.  HENT:  Right Ear: Hearing, tympanic membrane, external ear and ear canal normal.  Left Ear: Hearing, tympanic membrane, external ear and ear canal normal.  Nose: Nose normal. Right sinus exhibits no maxillary sinus tenderness and no frontal sinus tenderness. Left sinus exhibits no maxillary sinus tenderness and no frontal sinus tenderness.  Mouth/Throat: Uvula is midline, oropharynx is clear  and moist and mucous membranes are normal. Mucous membranes are not dry. No oropharyngeal exudate, posterior oropharyngeal edema or tonsillar abscesses.  Cardiovascular: Normal rate, regular rhythm, S1 normal, S2 normal, normal heart sounds and intact distal pulses. Exam reveals no gallop, no friction rub and no decreased pulses.  No murmur heard. Pulmonary/Chest: Effort normal. No stridor. No tachypnea. No respiratory distress. She has no wheezes. She has no rales.  Abdominal: She exhibits no distension.  Musculoskeletal: She exhibits no edema.  Lymphadenopathy:       Head (right side): No submandibular and no tonsillar adenopathy present.       Head (left side): No submandibular and no tonsillar adenopathy present.    She has no cervical adenopathy.  Neurological: She is alert.  Skin: Skin is warm and dry. She is not diaphoretic. No pallor.  Vitals reviewed.   No results found for this or any previous visit (from the past 72 hour(s)).  No results found.  ASSESSMENT AND PLAN:  Suzanne Coleman was seen today for sinus problem.  Diagnoses and all orders for this visit:  Acute bacterial rhinosinusitis -     doxycycline (VIBRAMYCIN) 100 MG capsule; Take 1 capsule (100 mg total) by mouth 2 (two) times daily for 10 days. Take with food.    The patient is advised to call or return to clinic if she does not see an improvement in symptoms, or to seek the care of the closest emergency  department if she worsens with the above plan.   Deliah Boston, MHS, PA-C Primary Care at [facility] 11/30/2017 3:45 PM

## 2017-11-30 NOTE — Patient Instructions (Signed)
     IF you received an x-ray today, you will receive an invoice from [facility]. Please contact [facility] at [phone] with questions or concerns regarding your invoice.   IF you received labwork today, you will receive an invoice from [facility]. Please contact [facility] at [phone] with questions or concerns regarding your invoice.   Our billing staff will not be able to assist you with questions regarding bills from these companies.  You will be contacted with the lab results as soon as they are available. The fastest way to get your results is to activate your My Chart account. Instructions are located on the last page of this paperwork. If you have not heard from us regarding the results in 2 weeks, please contact this office.     

## 2017-12-12 DIAGNOSIS — R51 Headache: Secondary | ICD-10-CM | POA: Diagnosis not present

## 2017-12-12 DIAGNOSIS — J019 Acute sinusitis, unspecified: Secondary | ICD-10-CM | POA: Diagnosis not present

## 2017-12-25 DIAGNOSIS — E876 Hypokalemia: Secondary | ICD-10-CM | POA: Diagnosis not present

## 2017-12-26 DIAGNOSIS — E876 Hypokalemia: Secondary | ICD-10-CM | POA: Diagnosis not present

## 2018-01-09 ENCOUNTER — Ambulatory Visit (INDEPENDENT_AMBULATORY_CARE_PROVIDER_SITE_OTHER): Payer: PPO | Admitting: Neurology

## 2018-01-09 ENCOUNTER — Encounter: Payer: Self-pay | Admitting: Neurology

## 2018-01-09 VITALS — BP 124/77 | HR 78 | Wt 141.5 lb

## 2018-01-09 DIAGNOSIS — I999 Unspecified disorder of circulatory system: Secondary | ICD-10-CM | POA: Diagnosis not present

## 2018-01-09 DIAGNOSIS — I739 Peripheral vascular disease, unspecified: Secondary | ICD-10-CM | POA: Insufficient documentation

## 2018-01-09 NOTE — Progress Notes (Signed)
PATIENT: Suzanne Coleman DOB: May 31, 1946  Chief Complaint  Patient presents with  . Follow-up    Headaches follow up     [place] is a 72 year old female, seen in refer by her primary care doctor Deland Pretty for evaluation of headache, initial evaluation was on October 08, 2017.  She was previously healthy, deny a history of headaches, her older sister died of brain tumor when she was young.  At the end of 2016, when she was taking care of aging parents, she had excessive stress, she noticed intermittent right parietal region needle prick pain, lasting for a few seconds, it was transient,  It reoccurred again in the middle of 2017, when she was again going through excessive stress.  The headaches only last for a few days, intermittent.  She had the worst appearance of right side needle prick headache on November 13,, 14, 15 2018, transient needle prick pain, but intermittent lasting for 3 days, she also had right skull sensitivity, soreness afterwards,  She denies visual change, no chewing difficulty, no diffuse body achy pain, she does not have headaches now.  She was seen by her primary care on October 01, 2017, laboratory evaluation showed normal CMP, creatinine 0.9 3, ESR 7, normal CBC, hemoglobin of 12.1, LDL 97, total cholesterol 165, vitamin D 58,  UPDATE January 09 2018: She is accompanied by her daughter at today's clinical visit, since last visit in December 2018, she has no significant headaches, laboratory evaluation showed normal ESR,  We have reviewed MRI of the brain in December 2018, extensive T2/FLAIR hyperintensity foci in the deep and subcortical white matter, with confluences in the parietal white matter, consistent with advanced chronic microvascular ischemic changes, more than expected for age,  She has no significant vascular risk factors, denies significant memory loss, joint exercise program  REVIEW OF SYSTEMS: Full 14 system review of  systems performed and notable only for headache  ALLERGIES: Allergies  Allergen Reactions  . Codeine     REACTION: hives, vomiting  . Sulfonamide Derivatives     REACTION: hives, vomiting    HOME MEDICATIONS: Current Outpatient Medications  Medication Sig Dispense Refill  . CALCIUM PO Take 500 mg by mouth daily.    . Cholecalciferol (VITAMIN D3 PO) Take 500 Units by mouth daily.    . Multiple Vitamin (MULTIVITAMIN) tablet Take 1 tablet by mouth daily.    . potassium chloride (K-DUR) 10 MEQ tablet TK 3 TS PO D tid  3  . vitamin B-12 (CYANOCOBALAMIN) 500 MCG tablet Take 500 mcg by mouth daily.     No current facility-administered medications for this visit.     PAST MEDICAL HISTORY: Past Medical History:  Diagnosis Date  . Headache   . MRSA carrier   . Osteopenia   . Vision abnormalities    cataracts    PAST SURGICAL HISTORY: Past Surgical History:  Procedure Laterality Date  . COLONOSCOPY    . SVD     x2    FAMILY HISTORY: Family History  Problem Relation Age of Onset  . Colon cancer Father   . Lung cancer Father   . Dementia Father   . Dementia Mother   . Cancer Mother        unsure of type    SOCIAL HISTORY:  Social History   Socioeconomic History  . Marital status: Single    Spouse name: Not on file  . Number of children: 2  . Years of  education: 16  . Highest education level: Bachelor's degree (e.g., BA, AB, BS)  Social Needs  . Financial resource strain: Not on file  . Food insecurity - worry: Not on file  . Food insecurity - inability: Not on file  . Transportation needs - medical: Not on file  . Transportation needs - non-medical: Not on file  Occupational History  . Occupation: Retired  Tobacco Use  . Smoking status: Former Research scientist (life sciences)  . Smokeless tobacco: Never Used  Substance and Sexual Activity  . Alcohol use: No    Alcohol/week: 0.0 oz    Frequency: Never  . Drug use: No  . Sexual activity: Not on file  Other Topics Concern  . Not  on file  Social History Narrative   Lives with her daughter.   Right-handed.   3 cups caffeine per month.     PHYSICAL EXAM   Vitals:   01/09/18 0743  BP: 124/77  Pulse: 78  Weight: 141 lb 8 oz (64.2 kg)    Not recorded      Body mass index is 25.47 kg/m.  PHYSICAL EXAMNIATION:  Gen: NAD, conversant, well nourised, obese, well groomed                     Cardiovascular: Regular rate rhythm, no peripheral edema, warm, nontender. Eyes: Conjunctivae clear without exudates or hemorrhage Neck: Supple, no carotid bruits. Pulmonary: Clear to auscultation bilaterally   NEUROLOGICAL EXAM:  MMSE - Mini Mental State Exam 01/09/2018  Orientation to time 4  Orientation to Place 5  Registration 3  Attention/ Calculation 5  Recall 2  Language- name 2 objects 2  Language- repeat 1  Language- follow 3 step command 3  Language- read & follow direction 1  Write a sentence 1  Copy design 1  Total score 28   CRANIAL NERVES: CN II: Visual fields are full to confrontation. Fundoscopic exam is normal with sharp discs and no vascular changes. Pupils are round equal and briskly reactive to light. CN III, IV, VI: extraocular movement are normal. No ptosis. CN V: Facial sensation is intact to pinprick in all 3 divisions bilaterally. Corneal responses are intact.  CN VII: Face is symmetric with normal eye closure and smile. CN VIII: Hearing is normal to rubbing fingers CN IX, X: Palate elevates symmetrically. Phonation is normal. CN XI: Head turning and shoulder shrug are intact CN XII: Tongue is midline with normal movements and no atrophy.  MOTOR: There is no pronator drift of out-stretched arms. Muscle bulk and tone are normal. Muscle strength is normal.  REFLEXES: Reflexes are 2+ and symmetric at the biceps, triceps, knees, and ankles. Plantar responses are flexor.  SENSORY: Intact to light touch, pinprick, positional sensation and vibratory sensation are intact in fingers and  toes.  COORDINATION: Rapid alternating movements and fine finger movements are intact. There is no dysmetria on finger-to-nose and heel-knee-shin.    GAIT/STANCE: Posture is normal. Gait is steady with normal steps, base, arm swing, and turning. Heel and toe walking are normal. Tandem gait is normal.  Romberg is absent.   DIAGNOSTIC DATA (LABS, IMAGING, TESTING) - I reviewed patient records, labs, notes, testing and imaging myself where available.   ASSESSMENT AND PLAN  Suzanne Coleman is a 71 y.o. female   Cerebral small vessel disease  She has no significant vascular risk factor other than aging  Echocardiogram, ultrasound carotid artery  I have suggested to her moderate exercise, increase water intake, aspirin daily  Marcial Pacas, M.D. Ph.D.  [facility] [address] [place], [place] [address] Ph: [phone] Fax: [phone]  CC: Deland Pretty, MD

## 2018-01-21 ENCOUNTER — Ambulatory Visit (HOSPITAL_COMMUNITY)
Admission: RE | Admit: 2018-01-21 | Discharge: 2018-01-21 | Disposition: A | Discharge status: Home / Self Care | Payer: PPO | Source: Ambulatory Visit | Attending: Neurology | Admitting: Neurology

## 2018-01-21 ENCOUNTER — Ambulatory Visit (HOSPITAL_BASED_OUTPATIENT_CLINIC_OR_DEPARTMENT_OTHER)
Admission: RE | Admit: 2018-01-21 | Discharge: 2018-01-21 | Disposition: A | Discharge status: Home / Self Care | Payer: PPO | Source: Ambulatory Visit | Attending: Neurology | Admitting: Neurology

## 2018-01-21 DIAGNOSIS — I739 Peripheral vascular disease, unspecified: Secondary | ICD-10-CM

## 2018-01-21 DIAGNOSIS — I5189 Other ill-defined heart diseases: Secondary | ICD-10-CM | POA: Diagnosis not present

## 2018-01-21 DIAGNOSIS — I999 Unspecified disorder of circulatory system: Secondary | ICD-10-CM | POA: Diagnosis not present

## 2018-01-21 DIAGNOSIS — I517 Cardiomegaly: Secondary | ICD-10-CM | POA: Insufficient documentation

## 2018-01-21 DIAGNOSIS — Z87891 Personal history of nicotine dependence: Secondary | ICD-10-CM | POA: Insufficient documentation

## 2018-01-21 NOTE — Progress Notes (Signed)
  Echocardiogram 2D Echocardiogram has been performed.  Suzanne SavoyCasey N Reagyn Coleman 01/21/2018, 8:50 AM

## 2018-01-21 NOTE — Progress Notes (Signed)
VASCULAR LAB PRELIMINARY  PRELIMINARY  PRELIMINARY  PRELIMINARY  Carotid duplex completed.    Preliminary report:  1-39% ICA plaquing. Vertebral artery flow is antegrade.   Broxton Broady, RVT 01/21/2018, 9:36 AM

## 2018-05-30 ENCOUNTER — Other Ambulatory Visit: Payer: Self-pay | Admitting: Internal Medicine

## 2018-05-30 DIAGNOSIS — Z1231 Encounter for screening mammogram for malignant neoplasm of breast: Secondary | ICD-10-CM

## 2018-08-19 ENCOUNTER — Ambulatory Visit
Admission: RE | Admit: 2018-08-19 | Discharge: 2018-08-19 | Disposition: A | Discharge status: Home / Self Care | Payer: PPO | Source: Ambulatory Visit | Attending: Internal Medicine | Admitting: Internal Medicine

## 2018-08-19 DIAGNOSIS — Z1231 Encounter for screening mammogram for malignant neoplasm of breast: Secondary | ICD-10-CM

## 2018-08-19 DIAGNOSIS — Z0001 Encounter for general adult medical examination with abnormal findings: Secondary | ICD-10-CM | POA: Diagnosis not present

## 2018-08-19 DIAGNOSIS — M8580 Other specified disorders of bone density and structure, unspecified site: Secondary | ICD-10-CM | POA: Diagnosis not present

## 2018-08-21 DIAGNOSIS — Z124 Encounter for screening for malignant neoplasm of cervix: Secondary | ICD-10-CM | POA: Diagnosis not present

## 2018-08-21 DIAGNOSIS — Z6824 Body mass index (BMI) 24.0-24.9, adult: Secondary | ICD-10-CM | POA: Diagnosis not present

## 2018-08-21 DIAGNOSIS — N952 Postmenopausal atrophic vaginitis: Secondary | ICD-10-CM | POA: Diagnosis not present

## 2018-08-21 DIAGNOSIS — N898 Other specified noninflammatory disorders of vagina: Secondary | ICD-10-CM | POA: Diagnosis not present

## 2018-08-26 DIAGNOSIS — Z0000 Encounter for general adult medical examination without abnormal findings: Secondary | ICD-10-CM | POA: Diagnosis not present

## 2018-08-26 DIAGNOSIS — Z23 Encounter for immunization: Secondary | ICD-10-CM | POA: Diagnosis not present

## 2018-08-26 DIAGNOSIS — Z800 Family history of malignant neoplasm of digestive organs: Secondary | ICD-10-CM | POA: Diagnosis not present

## 2018-08-26 DIAGNOSIS — M654 Radial styloid tenosynovitis [de Quervain]: Secondary | ICD-10-CM | POA: Diagnosis not present

## 2018-08-26 DIAGNOSIS — Z8639 Personal history of other endocrine, nutritional and metabolic disease: Secondary | ICD-10-CM | POA: Diagnosis not present

## 2018-08-26 DIAGNOSIS — M8580 Other specified disorders of bone density and structure, unspecified site: Secondary | ICD-10-CM | POA: Diagnosis not present

## 2018-09-30 DIAGNOSIS — Z8639 Personal history of other endocrine, nutritional and metabolic disease: Secondary | ICD-10-CM | POA: Diagnosis not present

## 2019-01-03 DIAGNOSIS — R634 Abnormal weight loss: Secondary | ICD-10-CM | POA: Diagnosis not present

## 2019-02-18 DIAGNOSIS — Z0000 Encounter for general adult medical examination without abnormal findings: Secondary | ICD-10-CM | POA: Diagnosis not present

## 2019-02-18 DIAGNOSIS — D62 Acute posthemorrhagic anemia: Secondary | ICD-10-CM | POA: Diagnosis not present

## 2019-02-18 DIAGNOSIS — B954 Other streptococcus as the cause of diseases classified elsewhere: Secondary | ICD-10-CM | POA: Diagnosis not present

## 2019-02-18 DIAGNOSIS — F329 Major depressive disorder, single episode, unspecified: Secondary | ICD-10-CM | POA: Diagnosis not present

## 2019-02-18 DIAGNOSIS — T3695XA Adverse effect of unspecified systemic antibiotic, initial encounter: Secondary | ICD-10-CM | POA: Diagnosis not present

## 2019-02-18 DIAGNOSIS — R0600 Dyspnea, unspecified: Secondary | ICD-10-CM | POA: Diagnosis not present

## 2019-02-18 DIAGNOSIS — I252 Old myocardial infarction: Secondary | ICD-10-CM | POA: Diagnosis not present

## 2019-02-18 DIAGNOSIS — I2510 Atherosclerotic heart disease of native coronary artery without angina pectoris: Secondary | ICD-10-CM | POA: Diagnosis not present

## 2019-02-18 DIAGNOSIS — E0590 Thyrotoxicosis, unspecified without thyrotoxic crisis or storm: Secondary | ICD-10-CM | POA: Diagnosis not present

## 2019-02-18 DIAGNOSIS — I10 Essential (primary) hypertension: Secondary | ICD-10-CM | POA: Diagnosis not present

## 2019-02-18 DIAGNOSIS — Z7902 Long term (current) use of antithrombotics/antiplatelets: Secondary | ICD-10-CM | POA: Diagnosis not present

## 2019-02-18 DIAGNOSIS — T8452XA Infection and inflammatory reaction due to internal left hip prosthesis, initial encounter: Secondary | ICD-10-CM | POA: Diagnosis not present

## 2019-02-18 DIAGNOSIS — B3789 Other sites of candidiasis: Secondary | ICD-10-CM | POA: Diagnosis not present

## 2019-02-18 DIAGNOSIS — K219 Gastro-esophageal reflux disease without esophagitis: Secondary | ICD-10-CM | POA: Diagnosis not present

## 2019-02-18 DIAGNOSIS — Z8673 Personal history of transient ischemic attack (TIA), and cerebral infarction without residual deficits: Secondary | ICD-10-CM | POA: Diagnosis not present

## 2019-02-18 DIAGNOSIS — K521 Toxic gastroenteritis and colitis: Secondary | ICD-10-CM | POA: Diagnosis not present

## 2019-02-18 DIAGNOSIS — M868X8 Other osteomyelitis, other site: Secondary | ICD-10-CM | POA: Diagnosis not present

## 2019-02-18 DIAGNOSIS — B957 Other staphylococcus as the cause of diseases classified elsewhere: Secondary | ICD-10-CM | POA: Diagnosis not present

## 2019-02-18 DIAGNOSIS — I350 Nonrheumatic aortic (valve) stenosis: Secondary | ICD-10-CM | POA: Diagnosis not present

## 2019-02-18 DIAGNOSIS — E785 Hyperlipidemia, unspecified: Secondary | ICD-10-CM | POA: Diagnosis not present

## 2019-02-18 DIAGNOSIS — Z79899 Other long term (current) drug therapy: Secondary | ICD-10-CM | POA: Diagnosis not present

## 2019-02-18 DIAGNOSIS — I739 Peripheral vascular disease, unspecified: Secondary | ICD-10-CM | POA: Diagnosis not present

## 2019-02-18 DIAGNOSIS — Z20828 Contact with and (suspected) exposure to other viral communicable diseases: Secondary | ICD-10-CM | POA: Diagnosis not present

## 2019-02-18 DIAGNOSIS — Z85828 Personal history of other malignant neoplasm of skin: Secondary | ICD-10-CM | POA: Diagnosis not present

## 2019-02-18 DIAGNOSIS — E876 Hypokalemia: Secondary | ICD-10-CM | POA: Diagnosis not present

## 2019-02-20 DIAGNOSIS — Z8614 Personal history of Methicillin resistant Staphylococcus aureus infection: Secondary | ICD-10-CM | POA: Diagnosis not present

## 2019-02-20 DIAGNOSIS — Z8639 Personal history of other endocrine, nutritional and metabolic disease: Secondary | ICD-10-CM | POA: Diagnosis not present

## 2019-06-09 ENCOUNTER — Other Ambulatory Visit: Payer: Self-pay

## 2019-07-10 ENCOUNTER — Other Ambulatory Visit: Payer: Self-pay | Admitting: Internal Medicine

## 2019-07-10 DIAGNOSIS — Z1231 Encounter for screening mammogram for malignant neoplasm of breast: Secondary | ICD-10-CM

## 2019-08-26 DIAGNOSIS — M8580 Other specified disorders of bone density and structure, unspecified site: Secondary | ICD-10-CM | POA: Diagnosis not present

## 2019-08-26 DIAGNOSIS — N895 Stricture and atresia of vagina: Secondary | ICD-10-CM | POA: Diagnosis not present

## 2019-08-26 DIAGNOSIS — Z01419 Encounter for gynecological examination (general) (routine) without abnormal findings: Secondary | ICD-10-CM | POA: Diagnosis not present

## 2019-08-27 ENCOUNTER — Ambulatory Visit
Admission: RE | Admit: 2019-08-27 | Discharge: 2019-08-27 | Disposition: A | Discharge status: Home / Self Care | Payer: PPO | Source: Ambulatory Visit | Attending: Internal Medicine | Admitting: Internal Medicine

## 2019-08-27 ENCOUNTER — Other Ambulatory Visit: Payer: Self-pay

## 2019-08-27 DIAGNOSIS — Z1231 Encounter for screening mammogram for malignant neoplasm of breast: Secondary | ICD-10-CM

## 2019-09-04 DIAGNOSIS — Z0000 Encounter for general adult medical examination without abnormal findings: Secondary | ICD-10-CM | POA: Diagnosis not present

## 2019-09-05 DIAGNOSIS — Z0000 Encounter for general adult medical examination without abnormal findings: Secondary | ICD-10-CM | POA: Diagnosis not present

## 2019-11-16 IMAGING — MG DIGITAL SCREENING BILATERAL MAMMOGRAM WITH TOMO AND CAD
8 series · 8 of 24 positions shown · non-contrast
Comparison: Previous exam(s).

CLINICAL DATA: Screening.

EXAM:
DIGITAL SCREENING BILATERAL MAMMOGRAM WITH TOMO AND CAD

[R MLO synth-2D]
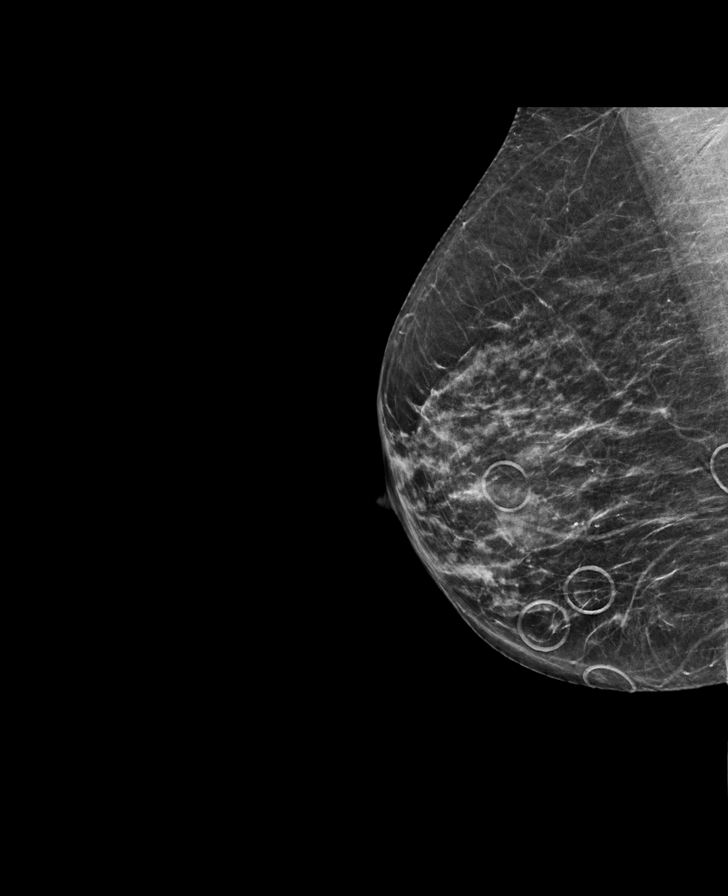

[R CC synth-2D]
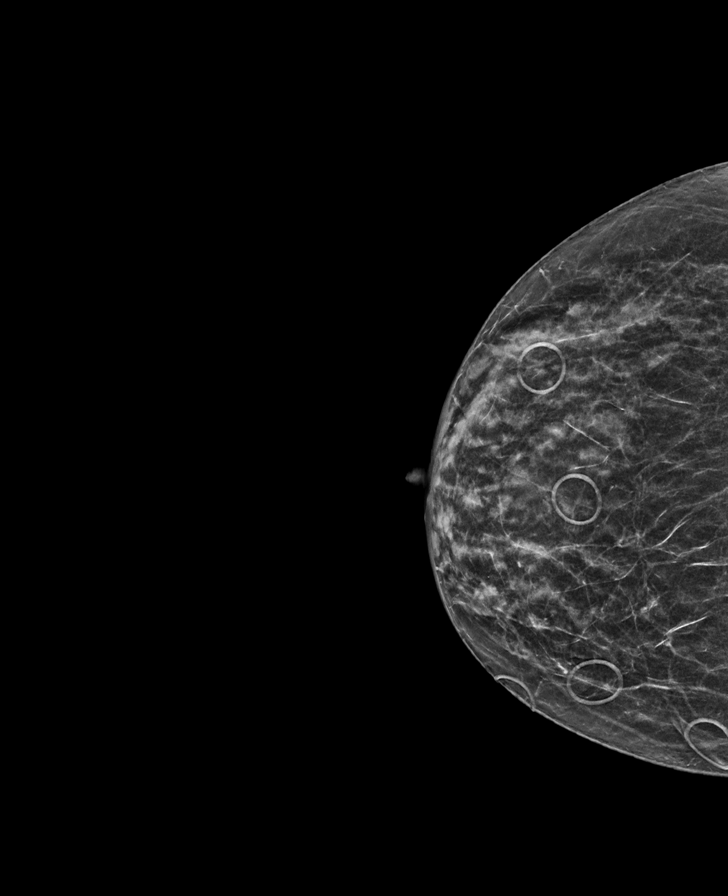

[L MLO synth-2D]
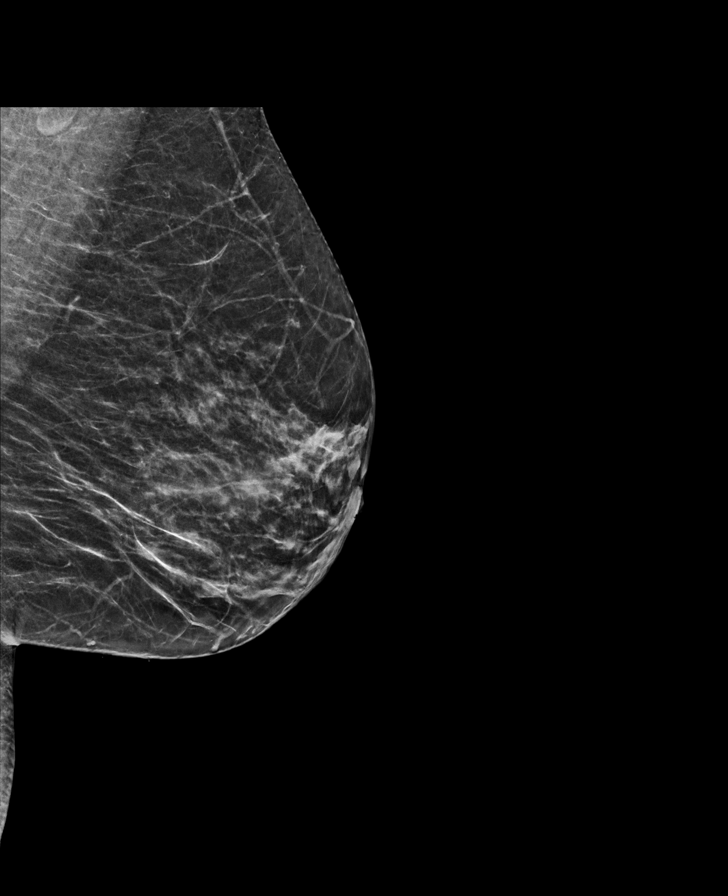

[L CC synth-2D]
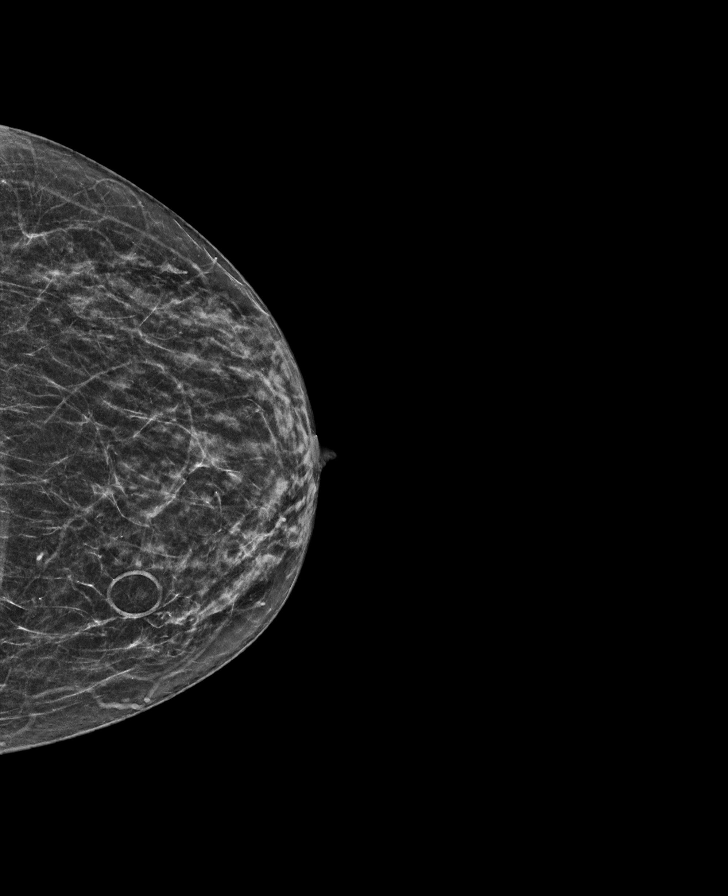

[L MLO tomo · tomo slice 28/55.0]
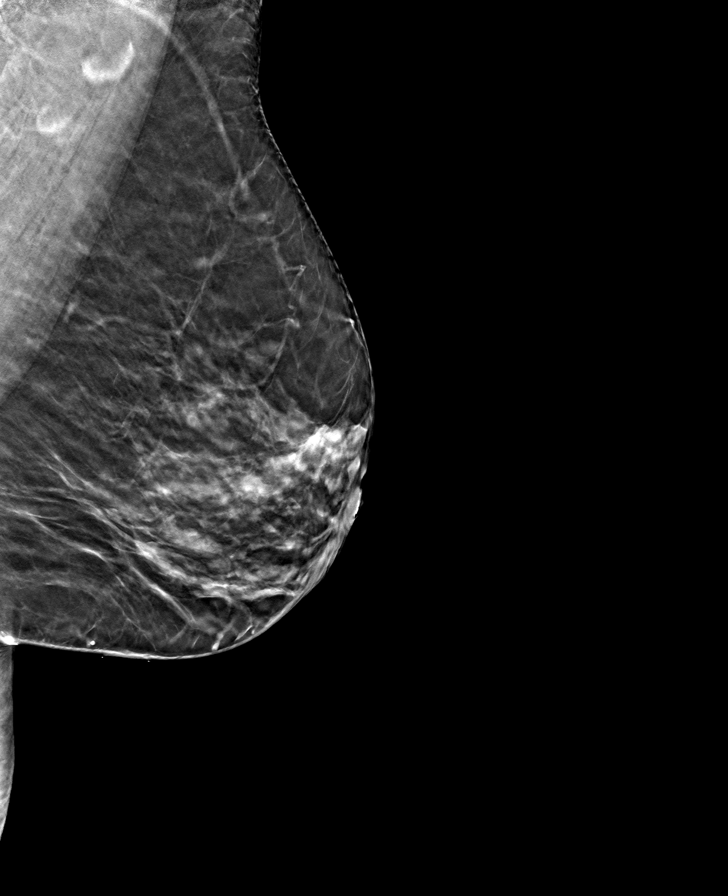

[R CC tomo · tomo slice 25/48.0]
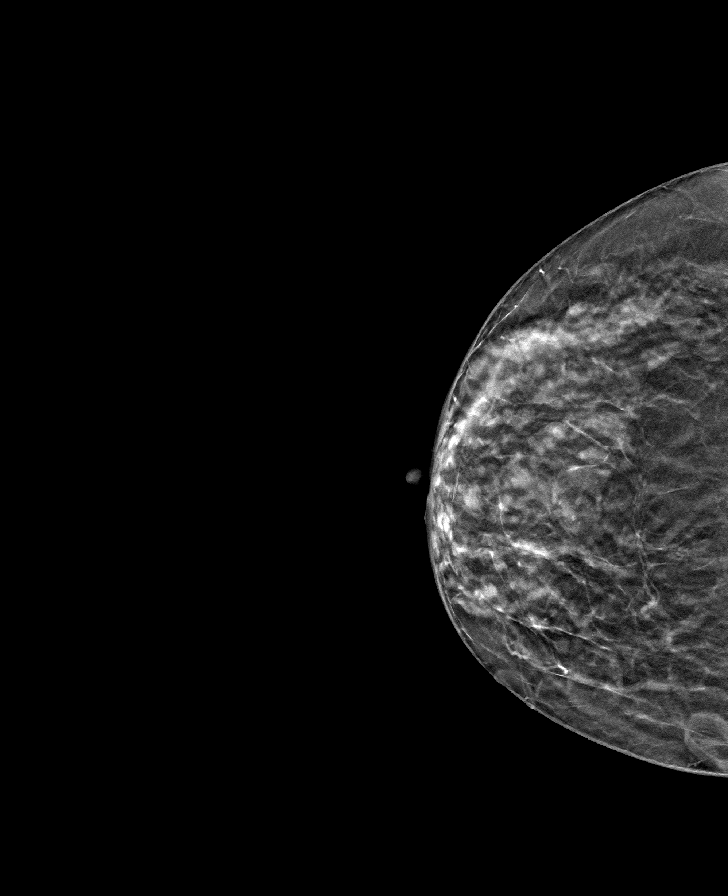

[L CC tomo · tomo slice 25/49.0]
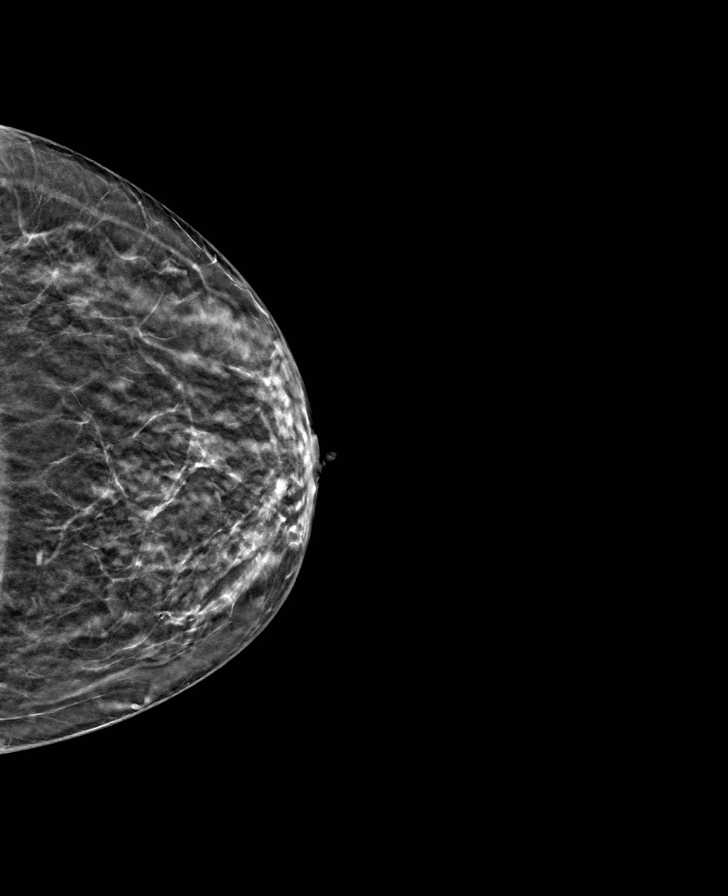

[R MLO tomo · tomo slice 27/54.0]
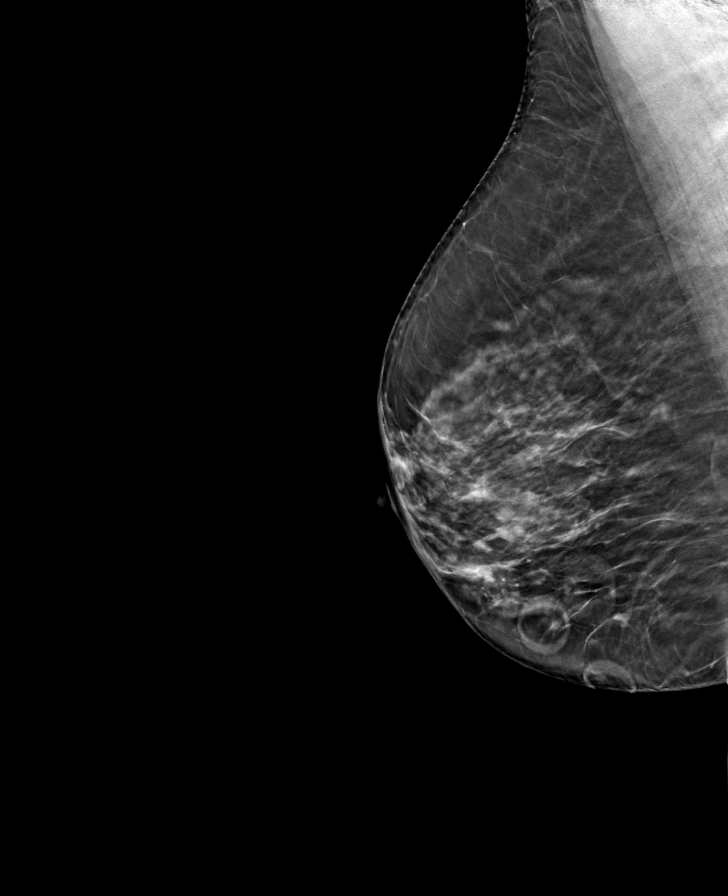

[8 of 24 positions shown; findings below may reference images not displayed]

ACR Breast Density Category b: There are scattered areas of
fibroglandular density.
FINDINGS: There are no findings suspicious for malignancy. Images were
processed with CAD.
IMPRESSION: No mammographic evidence of malignancy. A result letter of this
screening mammogram will be mailed directly to the patient.

RECOMMENDATION:
Screening mammogram in one year. (Code:[id number])

BI-RADS CATEGORY  1: Negative.

## 2020-01-08 ENCOUNTER — Ambulatory Visit: Payer: PPO | Attending: Internal Medicine

## 2020-01-08 DIAGNOSIS — Z23 Encounter for immunization: Secondary | ICD-10-CM

## 2020-01-08 NOTE — Progress Notes (Signed)
   Covid-19 Vaccination Clinic  Name:  Suzanne Coleman    MRN: [id number] DOB: 03-Nov-1946  01/08/2020  [name] was observed post Covid-19 immunization for 15 minutes without incident. She was provided with Vaccine Information Sheet and instruction to access the V-Safe system.   [name] was instructed to call 911 with any severe reactions post vaccine: [name] Difficulty breathing  . Swelling of face and throat  . A fast heartbeat  . A bad rash all over body  . Dizziness and weakness   Immunizations Administered    Name Date Dose VIS Date Route   Pfizer COVID-19 Vaccine 01/08/2020 12:03 PM 0.3 mL 10/17/2019 Intramuscular   Manufacturer: [employer], [employer]   Lot: [id number]   NDC: [address]-9

## 2020-02-04 ENCOUNTER — Ambulatory Visit: Payer: PPO | Attending: Internal Medicine

## 2020-02-04 DIAGNOSIS — Z23 Encounter for immunization: Secondary | ICD-10-CM

## 2020-02-04 NOTE — Progress Notes (Signed)
   Covid-19 Vaccination Clinic  Name:  LEVADA BOWERSOX    MRN: [id number] DOB: [birth year]  02/04/2020  Ms. Barcelo was observed post Covid-19 immunization for 15 minutes without incident. She was provided with Vaccine Information Sheet and instruction to access the V-Safe system.   Ms. Danek was instructed to call 911 with any severe reactions post vaccine: [name] Difficulty breathing  . Swelling of face and throat  . A fast heartbeat  . A bad rash all over body  . Dizziness and weakness   Immunizations Administered    Name Date Dose VIS Date Route   Pfizer COVID-19 Vaccine 02/04/2020  9:01 AM 0.3 mL 10/17/2019 Intramuscular   Manufacturer: [employer], [employer]   Lot: [id number]   NDC: [address]-5

## 2020-02-18 DIAGNOSIS — H40013 Open angle with borderline findings, low risk, bilateral: Secondary | ICD-10-CM | POA: Diagnosis not present

## 2020-02-18 DIAGNOSIS — H25813 Combined forms of age-related cataract, bilateral: Secondary | ICD-10-CM | POA: Diagnosis not present

## 2020-04-01 DIAGNOSIS — H25812 Combined forms of age-related cataract, left eye: Secondary | ICD-10-CM | POA: Diagnosis not present

## 2020-07-05 DIAGNOSIS — Z0000 Encounter for general adult medical examination without abnormal findings: Secondary | ICD-10-CM | POA: Diagnosis not present

## 2020-07-05 DIAGNOSIS — E559 Vitamin D deficiency, unspecified: Secondary | ICD-10-CM | POA: Diagnosis not present

## 2020-07-05 DIAGNOSIS — M8580 Other specified disorders of bone density and structure, unspecified site: Secondary | ICD-10-CM | POA: Diagnosis not present

## 2020-07-05 DIAGNOSIS — M810 Age-related osteoporosis without current pathological fracture: Secondary | ICD-10-CM | POA: Diagnosis not present

## 2020-07-16 DIAGNOSIS — Z0000 Encounter for general adult medical examination without abnormal findings: Secondary | ICD-10-CM | POA: Diagnosis not present

## 2020-07-16 DIAGNOSIS — Z8614 Personal history of Methicillin resistant Staphylococcus aureus infection: Secondary | ICD-10-CM | POA: Diagnosis not present

## 2020-07-16 DIAGNOSIS — L811 Chloasma: Secondary | ICD-10-CM | POA: Diagnosis not present

## 2020-07-16 DIAGNOSIS — Z23 Encounter for immunization: Secondary | ICD-10-CM | POA: Diagnosis not present

## 2020-07-16 DIAGNOSIS — Z8639 Personal history of other endocrine, nutritional and metabolic disease: Secondary | ICD-10-CM | POA: Diagnosis not present

## 2020-07-16 DIAGNOSIS — M8580 Other specified disorders of bone density and structure, unspecified site: Secondary | ICD-10-CM | POA: Diagnosis not present

## 2020-07-23 DIAGNOSIS — M810 Age-related osteoporosis without current pathological fracture: Secondary | ICD-10-CM | POA: Diagnosis not present

## 2020-07-23 DIAGNOSIS — E876 Hypokalemia: Secondary | ICD-10-CM | POA: Diagnosis not present

## 2020-07-23 DIAGNOSIS — Z23 Encounter for immunization: Secondary | ICD-10-CM | POA: Diagnosis not present

## 2020-08-21 ENCOUNTER — Ambulatory Visit: Payer: PPO | Attending: Internal Medicine

## 2020-08-21 DIAGNOSIS — Z23 Encounter for immunization: Secondary | ICD-10-CM

## 2020-08-21 NOTE — Progress Notes (Signed)
° °  Covid-19 Vaccination Clinic  Name:  Suzanne Coleman    MRN: [id number] DOB: [birth year]  08/21/2020  [name] was observed post Covid-19 immunization for 15 minutes without incident. She was provided with Vaccine Information Sheet and instruction to access the V-Safe system.   Ms. Cheese was instructed to call 911 with any severe reactions post vaccine:  Difficulty breathing   Swelling of face and throat   A fast heartbeat   A bad rash all over body   Dizziness and weakness

## 2020-08-30 DIAGNOSIS — L811 Chloasma: Secondary | ICD-10-CM | POA: Diagnosis not present

## 2020-08-31 DIAGNOSIS — H40013 Open angle with borderline findings, low risk, bilateral: Secondary | ICD-10-CM | POA: Diagnosis not present

## 2020-08-31 DIAGNOSIS — Z961 Presence of intraocular lens: Secondary | ICD-10-CM | POA: Diagnosis not present

## 2020-08-31 DIAGNOSIS — H25811 Combined forms of age-related cataract, right eye: Secondary | ICD-10-CM | POA: Diagnosis not present

## 2020-09-13 ENCOUNTER — Other Ambulatory Visit: Payer: Self-pay | Admitting: Internal Medicine

## 2020-09-13 DIAGNOSIS — Z0000 Encounter for general adult medical examination without abnormal findings: Secondary | ICD-10-CM

## 2020-09-17 DIAGNOSIS — Z01419 Encounter for gynecological examination (general) (routine) without abnormal findings: Secondary | ICD-10-CM | POA: Diagnosis not present

## 2020-09-17 DIAGNOSIS — N895 Stricture and atresia of vagina: Secondary | ICD-10-CM | POA: Diagnosis not present

## 2020-09-17 DIAGNOSIS — N952 Postmenopausal atrophic vaginitis: Secondary | ICD-10-CM | POA: Diagnosis not present

## 2020-09-17 DIAGNOSIS — Z6826 Body mass index (BMI) 26.0-26.9, adult: Secondary | ICD-10-CM | POA: Diagnosis not present

## 2020-10-03 DIAGNOSIS — H2511 Age-related nuclear cataract, right eye: Secondary | ICD-10-CM | POA: Diagnosis not present

## 2020-10-07 DIAGNOSIS — H2511 Age-related nuclear cataract, right eye: Secondary | ICD-10-CM | POA: Diagnosis not present

## 2020-10-22 ENCOUNTER — Ambulatory Visit
Admission: RE | Admit: 2020-10-22 | Discharge: 2020-10-22 | Disposition: A | Discharge status: Home / Self Care | Payer: PPO | Source: Ambulatory Visit | Attending: Internal Medicine | Admitting: Internal Medicine

## 2020-10-22 ENCOUNTER — Other Ambulatory Visit: Payer: Self-pay

## 2020-10-22 ENCOUNTER — Ambulatory Visit: Payer: PPO

## 2020-10-22 DIAGNOSIS — Z0000 Encounter for general adult medical examination without abnormal findings: Secondary | ICD-10-CM

## 2020-10-22 DIAGNOSIS — Z1231 Encounter for screening mammogram for malignant neoplasm of breast: Secondary | ICD-10-CM | POA: Diagnosis not present

## 2021-02-11 DIAGNOSIS — H40013 Open angle with borderline findings, low risk, bilateral: Secondary | ICD-10-CM | POA: Diagnosis not present

## 2021-02-11 DIAGNOSIS — Z961 Presence of intraocular lens: Secondary | ICD-10-CM | POA: Diagnosis not present

## 2021-07-19 DIAGNOSIS — Z0000 Encounter for general adult medical examination without abnormal findings: Secondary | ICD-10-CM | POA: Diagnosis not present

## 2021-07-19 DIAGNOSIS — Z8639 Personal history of other endocrine, nutritional and metabolic disease: Secondary | ICD-10-CM | POA: Diagnosis not present

## 2021-07-25 ENCOUNTER — Other Ambulatory Visit: Payer: Self-pay | Admitting: Internal Medicine

## 2021-07-25 DIAGNOSIS — Z1231 Encounter for screening mammogram for malignant neoplasm of breast: Secondary | ICD-10-CM

## 2021-07-28 DIAGNOSIS — M2041 Other hammer toe(s) (acquired), right foot: Secondary | ICD-10-CM | POA: Diagnosis not present

## 2021-07-28 DIAGNOSIS — M2042 Other hammer toe(s) (acquired), left foot: Secondary | ICD-10-CM | POA: Diagnosis not present

## 2021-07-28 DIAGNOSIS — M810 Age-related osteoporosis without current pathological fracture: Secondary | ICD-10-CM | POA: Diagnosis not present

## 2021-07-28 DIAGNOSIS — Z0000 Encounter for general adult medical examination without abnormal findings: Secondary | ICD-10-CM | POA: Diagnosis not present

## 2021-07-28 DIAGNOSIS — E876 Hypokalemia: Secondary | ICD-10-CM | POA: Diagnosis not present

## 2021-07-28 DIAGNOSIS — E559 Vitamin D deficiency, unspecified: Secondary | ICD-10-CM | POA: Diagnosis not present

## 2021-07-29 ENCOUNTER — Other Ambulatory Visit: Payer: Self-pay | Admitting: Internal Medicine

## 2021-07-29 DIAGNOSIS — Z0001 Encounter for general adult medical examination with abnormal findings: Secondary | ICD-10-CM

## 2021-08-04 ENCOUNTER — Encounter: Payer: Self-pay | Admitting: Gastroenterology

## 2021-08-10 ENCOUNTER — Emergency Department
Admission: RE | Admit: 2021-08-10 | Discharge: 2021-08-10 | Disposition: A | Discharge status: Home / Self Care | Payer: PPO | Source: Ambulatory Visit | Attending: Family Medicine | Admitting: Family Medicine

## 2021-08-10 ENCOUNTER — Other Ambulatory Visit: Payer: Self-pay

## 2021-08-10 ENCOUNTER — Emergency Department (INDEPENDENT_AMBULATORY_CARE_PROVIDER_SITE_OTHER): Payer: PPO

## 2021-08-10 VITALS — BP 156/90 | HR 73 | Resp 16

## 2021-08-10 DIAGNOSIS — M25521 Pain in right elbow: Secondary | ICD-10-CM

## 2021-08-10 DIAGNOSIS — M25531 Pain in right wrist: Secondary | ICD-10-CM | POA: Diagnosis not present

## 2021-08-10 DIAGNOSIS — S52124A Nondisplaced fracture of head of right radius, initial encounter for closed fracture: Secondary | ICD-10-CM

## 2021-08-10 DIAGNOSIS — M25421 Effusion, right elbow: Secondary | ICD-10-CM | POA: Diagnosis not present

## 2021-08-10 MED ORDER — HYDROCODONE-ACETAMINOPHEN 5-325 MG PO TABS: 1.0000 | ORAL_TABLET | Freq: Four times a day (QID) | ORAL | 0 refills | Status: DC | PRN

## 2021-08-10 NOTE — ED Provider Notes (Signed)
[facility]    CSN: [id number] Arrival date & time: 08/10/21  1102      History   Chief Complaint Chief Complaint  Patient presents with   Arm Injury    right    HPI Suzanne Coleman is a 75 y.o. female.   HPI  Patient fell last night.  Landed on outstretched hand.  Has pain in her wrist and elbow.  Is here for evaluation. Has history of osteopenia.  Does not recall when her last bone density was performed.  Past Medical History:  Diagnosis Date   Headache    MRSA carrier    Osteopenia    Vision abnormalities    cataracts    Patient Active Problem List   Diagnosis Date Noted   Small vessel disease (HCC) 01/09/2018   New onset headache 10/08/2017    Past Surgical History:  Procedure Laterality Date   COLONOSCOPY     SVD     x2    OB History   No obstetric history on file.      Home Medications    Prior to Admission medications   Medication Sig Start Date End Date Taking? Authorizing Provider  HYDROcodone-acetaminophen (NORCO/VICODIN) 5-325 MG tablet Take 1-2 tablets by mouth every 6 (six) hours as needed. 08/10/21  Yes Eustace Moore, MD  CALCIUM PO Take 500 mg by mouth daily.    [provider]  Cholecalciferol (VITAMIN D3 PO) Take 500 Units by mouth daily.    [provider]  Multiple Vitamin (MULTIVITAMIN) tablet Take 1 tablet by mouth daily.    [provider]  potassium chloride (K-DUR) 10 MEQ tablet TK 3 TS PO D tid 12/11/17   [provider]  vitamin B-12 (CYANOCOBALAMIN) 500 MCG tablet Take 500 mcg by mouth daily.    [provider]    Family History Family History  Problem Relation Age of Onset   Colon cancer Father    Lung cancer Father    Dementia Father    Dementia Mother    Cancer Mother        unsure of type   Breast cancer Paternal Aunt    Breast cancer Cousin    Breast cancer Paternal Aunt    Breast cancer Cousin     Social History Social History   Tobacco Use    Smoking status: Former   Smokeless tobacco: Never  Building services engineer Use: Never used  Substance Use Topics   Alcohol use: No    Alcohol/week: 0.0 standard drinks   Drug use: No     Allergies   Codeine and Sulfonamide derivatives   Review of Systems Review of Systems See HPI  Physical Exam Triage Vital Signs ED Triage Vitals  Enc Vitals Group     BP 08/10/21 1125 (!) 156/90     Pulse Rate 08/10/21 1125 73     Resp 08/10/21 1125 16     Temp --      Temp src --      SpO2 08/10/21 1125 100 %     Weight --      Height --      Head Circumference --      Peak Flow --      Pain Score 08/10/21 1120 10     Pain Loc --      Pain Edu? --      Excl. in GC? --    No data found.  Updated Vital Signs  BP (!) 156/90 (BP Location: Left Arm)   Pulse 73   Resp 16   SpO2 100%       Physical Exam Constitutional:      General: She is in acute distress.     Appearance: She is well-developed and normal weight.  HENT:     Head: Normocephalic and atraumatic.     Mouth/Throat:     Comments: Mask is in place Eyes:     Conjunctiva/sclera: Conjunctivae normal.     Pupils: Pupils are equal, round, and reactive to light.  Cardiovascular:     Rate and Rhythm: Normal rate.  Pulmonary:     Effort: Pulmonary effort is normal. No respiratory distress.  Abdominal:     General: There is no distension.     Palpations: Abdomen is soft.  Musculoskeletal:        General: Normal range of motion.     Cervical back: Normal range of motion.     Comments: Patient is uncomfortable.  Holding right arm close to her body.  There is tenderness that is mild over the carpal bones dorsally.  Good range of motion of the wrist.  There is tenderness also in the lateral elbow region.  Patient cannot fully extend her elbow or supinate hand.  Skin:    General: Skin is warm and dry.  Neurological:     Mental Status: She is alert.     UC Treatments / Results  Labs (all labs ordered are listed, but  only abnormal results are displayed) Labs Reviewed - No data to display  EKG   Radiology DG Elbow Complete Right  Result Date: 08/10/2021 CLINICAL DATA:  Pt fell down stairs last night, c/o pain in rt elbow and having very hard time extending it. EXAM: RIGHT ELBOW - COMPLETE 3+ VIEW COMPARISON:  None. FINDINGS: No fracture.  Joint normally spaced and aligned. Positive joint effusion. Surrounding soft tissues are unremarkable. IMPRESSION: 1. Positive right elbow joint effusion, but no fracture visualized. Normally aligned elbow joint. Electronically Signed   By: Amie Portland M.D.   On: 08/10/2021 13:13   DG Forearm Right  Result Date: 08/10/2021 CLINICAL DATA:  Larey Seat last night with wrist and elbow pain. EXAM: RIGHT FOREARM - 2 VIEW COMPARISON:  None FINDINGS: There is a large elbow joint effusion likely indicating an intra-articular fracture. This fracture is not demonstrated clearly on these radiographs, but there may be a radial head fracture. Recommend elbow radiography. No fracture of the radius or ulna in the more distal forearm. IMPRESSION: Elbow joint effusion. Question radial head/neck fracture. Elbow films recommended. No fracture of the more distal radius or ulna. Electronically Signed   By: Paulina Fusi M.D.   On: 08/10/2021 12:28   DG Wrist Complete Right  Result Date: 08/10/2021 CLINICAL DATA:  Larey Seat last night with wrist and elbow pain. EXAM: RIGHT WRIST - COMPLETE 3+ VIEW COMPARISON:  None. FINDINGS: There is no evidence of fracture or dislocation. There is no evidence of arthropathy or other focal bone abnormality. Soft tissues are unremarkable. IMPRESSION: Negative. Electronically Signed   By: Paulina Fusi M.D.   On: 08/10/2021 12:27    Procedures Procedures (including critical care time)  Medications Ordered in UC Medications - No data to display  Initial Impression / Assessment and Plan / UC Course  I have reviewed the triage vital signs and the nursing notes.  Pertinent  labs & imaging results that were available during my care of the patient were reviewed by  me and considered in my medical decision making (see chart for details).    The mechanism of injury, the physical examination, and the x-rays suggestA radial head fracture.  Avoiding to immobilize her with a sling and have her follow-up with orthopedics.  Final Clinical Impressions(s) / UC Diagnoses   Final diagnoses:  Closed nondisplaced fracture of head of right radius, initial encounter     Discharge Instructions      Wear sling and try to prevent movement of arm Use ice for 20 minutes every couple of hours May take Tylenol, Advil, Aleve for mild pain Take Tylenol with hydrocodone for severe pain.  Do not drive on hydrocodone. Call today to make an appointment with an orthopedic for later this week Talk to your primary care doctor about a bone density if you have not had one recently   ED Prescriptions     Medication Sig Dispense Auth. Provider   HYDROcodone-acetaminophen (NORCO/VICODIN) 5-325 MG tablet Take 1-2 tablets by mouth every 6 (six) hours as needed. 10 tablet Eustace Moore, MD      I have reviewed the PDMP during this encounter.   Eustace Moore, MD 08/10/21 1328

## 2021-08-10 NOTE — ED Triage Notes (Signed)
Patient reports falling down 2-3 stairs last night injuring her right arm/wrist. Good cap refill, pulse 2+. No previous injury. Ice at home. Taken tylenol.

## 2021-08-10 NOTE — Discharge Instructions (Signed)
Wear sling and try to prevent movement of arm Use ice for 20 minutes every couple of hours May take Tylenol, Advil, Aleve for mild pain Take Tylenol with hydrocodone for severe pain.  Do not drive on hydrocodone. Call today to make an appointment with an orthopedic for later this week Talk to your primary care doctor about a bone density if you have not had one recently

## 2021-08-18 DIAGNOSIS — M25521 Pain in right elbow: Secondary | ICD-10-CM | POA: Insufficient documentation

## 2021-08-23 DIAGNOSIS — M25531 Pain in right wrist: Secondary | ICD-10-CM | POA: Diagnosis not present

## 2021-08-25 DIAGNOSIS — M25521 Pain in right elbow: Secondary | ICD-10-CM | POA: Diagnosis not present

## 2021-08-26 ENCOUNTER — Ambulatory Visit
Admission: RE | Admit: 2021-08-26 | Discharge: 2021-08-26 | Disposition: A | Discharge status: Home / Self Care | Payer: PPO | Source: Ambulatory Visit | Attending: Internal Medicine | Admitting: Internal Medicine

## 2021-08-26 DIAGNOSIS — Z0001 Encounter for general adult medical examination with abnormal findings: Secondary | ICD-10-CM

## 2021-08-30 DIAGNOSIS — K449 Diaphragmatic hernia without obstruction or gangrene: Secondary | ICD-10-CM | POA: Diagnosis not present

## 2021-08-30 DIAGNOSIS — Z961 Presence of intraocular lens: Secondary | ICD-10-CM | POA: Diagnosis not present

## 2021-08-30 DIAGNOSIS — H40013 Open angle with borderline findings, low risk, bilateral: Secondary | ICD-10-CM | POA: Diagnosis not present

## 2021-08-30 DIAGNOSIS — H353131 Nonexudative age-related macular degeneration, bilateral, early dry stage: Secondary | ICD-10-CM | POA: Diagnosis not present

## 2021-09-06 DIAGNOSIS — M25521 Pain in right elbow: Secondary | ICD-10-CM | POA: Diagnosis not present

## 2021-09-08 ENCOUNTER — Encounter: Payer: Self-pay | Admitting: Gastroenterology

## 2021-09-13 DIAGNOSIS — M25521 Pain in right elbow: Secondary | ICD-10-CM | POA: Diagnosis not present

## 2021-09-19 DIAGNOSIS — M25521 Pain in right elbow: Secondary | ICD-10-CM | POA: Diagnosis not present

## 2021-09-26 DIAGNOSIS — N952 Postmenopausal atrophic vaginitis: Secondary | ICD-10-CM | POA: Diagnosis not present

## 2021-09-26 DIAGNOSIS — N899 Noninflammatory disorder of vagina, unspecified: Secondary | ICD-10-CM | POA: Insufficient documentation

## 2021-09-26 DIAGNOSIS — Z6825 Body mass index (BMI) 25.0-25.9, adult: Secondary | ICD-10-CM | POA: Diagnosis not present

## 2021-09-26 DIAGNOSIS — Z7689 Persons encountering health services in other specified circumstances: Secondary | ICD-10-CM | POA: Diagnosis not present

## 2021-09-26 DIAGNOSIS — N9089 Other specified noninflammatory disorders of vulva and perineum: Secondary | ICD-10-CM | POA: Diagnosis not present

## 2021-09-26 DIAGNOSIS — Z01419 Encounter for gynecological examination (general) (routine) without abnormal findings: Secondary | ICD-10-CM | POA: Diagnosis not present

## 2021-10-14 ENCOUNTER — Encounter: Payer: Self-pay | Admitting: Internal Medicine

## 2021-10-14 ENCOUNTER — Ambulatory Visit: Payer: No Typology Code available for payment source | Admitting: Gastroenterology

## 2021-10-14 DIAGNOSIS — M25521 Pain in right elbow: Secondary | ICD-10-CM | POA: Diagnosis not present

## 2021-10-24 ENCOUNTER — Ambulatory Visit
Admission: RE | Admit: 2021-10-24 | Discharge: 2021-10-24 | Disposition: A | Discharge status: Home / Self Care | Payer: PPO | Source: Ambulatory Visit | Attending: Internal Medicine | Admitting: Internal Medicine

## 2021-10-24 DIAGNOSIS — Z1231 Encounter for screening mammogram for malignant neoplasm of breast: Secondary | ICD-10-CM | POA: Diagnosis not present

## 2021-11-29 ENCOUNTER — Encounter: Payer: Self-pay | Admitting: Gastroenterology

## 2021-11-29 ENCOUNTER — Ambulatory Visit: Payer: PPO | Admitting: Gastroenterology

## 2021-11-29 VITALS — BP 140/80 | HR 80 | Ht 62.0 in | Wt 137.0 lb

## 2021-11-29 DIAGNOSIS — Z1211 Encounter for screening for malignant neoplasm of colon: Secondary | ICD-10-CM | POA: Diagnosis not present

## 2021-11-29 DIAGNOSIS — K449 Diaphragmatic hernia without obstruction or gangrene: Secondary | ICD-10-CM

## 2021-11-29 DIAGNOSIS — K219 Gastro-esophageal reflux disease without esophagitis: Secondary | ICD-10-CM

## 2021-11-29 NOTE — Progress Notes (Signed)
Suzanne Coleman    [id number]    February 19, 1946  Primary Care Physician:Pharr, Zollie Beckers, MD  Referring Physician: Merri Brunette, MD [address] [place],  [place] [address]   Chief complaint: Hiatal hernia, GERD  HPI:  76 year old very pleasant female here with complaints of reflux related symptoms worse in the past 1 to 2 years She is taking over-the-counter antacids with improvement and she has also started changing her diet.  She has breakthrough heartburn specially worse when she eats a heavy meal. She has family history of colon cancer in her father and also multiple second-degree relatives. Last colonoscopy August 2017 showed diverticulosis and hemorrhoids otherwise unremarkable  She has history of iron deficiency is on oral iron supplements  Denies any nausea, vomiting, abdominal pain, melena or bright red blood per rectum   Outpatient Encounter Medications as of 11/29/2021  Medication Sig   alendronate (FOSAMAX) 70 MG tablet Take 70 mg by mouth once a week.   aspirin 81 MG chewable tablet Chew 1 tablet by mouth daily.   CALCIUM PO Take 500 mg by mouth daily.   Cholecalciferol (VITAMIN D3 PO) Take 500 Units by mouth daily.   Ferrous Sulfate (IRON) 325 (65 Fe) MG TABS Take 1 tablet by mouth daily.   Multiple Vitamin (MULTIVITAMIN) tablet Take 1 tablet by mouth daily.   potassium chloride (K-DUR) 10 MEQ tablet TK 3 TS PO D tid   vitamin B-12 (CYANOCOBALAMIN) 500 MCG tablet Take 500 mcg by mouth daily.   Zinc 50 MG TABS Take 1 tablet by mouth daily.   [DISCONTINUED] HYDROcodone-acetaminophen (NORCO/VICODIN) 5-325 MG tablet Take 1-2 tablets by mouth every 6 (six) hours as needed.   No facility-administered encounter medications on file as of 11/29/2021.    Allergies as of 11/29/2021 - Review Complete 11/29/2021  Allergen Reaction Noted   Codeine  12/23/2010   Sulfonamide derivatives  12/23/2010    Past Medical History:  Diagnosis Date   Colon  polyps    GERD (gastroesophageal reflux disease)    Headache    Hiatal hernia    Low blood potassium    MRSA carrier    Osteopenia    Vision abnormalities    cataracts    Past Surgical History:  Procedure Laterality Date   CATARACT EXTRACTION, BILATERAL Bilateral    COLONOSCOPY     SVD     x2    Family History  Problem Relation Age of Onset   Dementia Mother    Cancer Mother        unsure of type, METASTATIC   Colon cancer Father    Lung cancer Father    Dementia Father    Leukemia Sister    Brain cancer Sister        tumor on the  brain   Dementia Maternal Grandmother    Breast cancer Paternal Aunt    Breast cancer Paternal Aunt    Breast cancer Cousin    Breast cancer Cousin     Social History   Socioeconomic History   Marital status: Single    Spouse name: Not on file   Number of children: 2   Years of education: 16   Highest education level: Bachelor's degree (e.g., BA, AB, BS)  Occupational History   Occupation: Retired  Tobacco Use   Smoking status: Former    Types: Cigarettes    Quit date: 1977    Years since quitting: 14.0  Smokeless tobacco: Never   Tobacco comments:    Social weekend smoker  Vaping Use   Vaping Use: Never used  Substance and Sexual Activity   Alcohol use: No    Alcohol/week: 0.0 standard drinks   Drug use: No   Sexual activity: Not on file  Other Topics Concern   Not on file  Social History Narrative   Lives with her daughter.   Right-handed.   3 cups caffeine per month.   Social Determinants of Health   Financial Resource Strain: Not on file  Food Insecurity: Not on file  Transportation Needs: Not on file  Physical Activity: Not on file  Stress: Not on file  Social Connections: Not on file  Intimate Partner Violence: Not on file      Review of systems: All other review of systems negative except as mentioned in the HPI.   Physical Exam: Vitals:   11/29/21 0903  BP: 140/80  Pulse: 80   Body mass  index is 25.06 kg/m. Gen:      No acute distress HEENT:  sclera anicteric Abd:      soft, non-tender; no palpable masses, no distension Ext:    No edema Neuro: alert and oriented x 3 Psych: normal mood and affect  Data Reviewed:  Reviewed labs, radiology imaging, old records and pertinent past GI work up   Assessment and Plan/Recommendations:  76 year old very pleasant female with family history of colon cancer said worsening GERD symptoms which improved with antireflux measures She is due for colorectal cancer screening She has history of chronic iron deficiency We will plan to proceed with EGD along with colonoscopy for further evaluation of worsening GERD symptoms, will need to exclude erosive esophagitis or hiatal hernia    The risks and benefits as well as alternatives of endoscopic procedure(s) have been discussed and reviewed. All questions answered. The patient agrees to proceed.  Return after procedure for follow-up visit  The patient was provided an opportunity to ask questions and all were answered. The patient agreed with the plan and demonstrated an understanding of the instructions.  Iona Beard , MD    CC: Merri Brunette, MD

## 2021-11-29 NOTE — Patient Instructions (Addendum)
You have been scheduled for an endoscopy and colonoscopy. Please follow the written instructions given to you at your visit today. Please pick up your prep supplies at the pharmacy within the next 1-3 days. If you use inhalers (even only as needed), please bring them with you on the day of your procedure.   Gastroesophageal Reflux Disease, Adult Gastroesophageal reflux (GER) happens when acid from the stomach flows up into the tube that connects the mouth and the stomach (esophagus). Normally, food travels down the esophagus and stays in the stomach to be digested. However, when a person has GER, food and stomach acid sometimes move back up into the esophagus. If this becomes a more serious problem, the person may be diagnosed with a disease called gastroesophageal reflux disease (GERD). GERD occurs when the reflux: Happens often. Causes frequent or severe symptoms. Causes problems such as damage to the esophagus. When stomach acid comes in contact with the esophagus, the acid may cause inflammation in the esophagus. Over time, GERD may create small holes (ulcers) in the lining of the esophagus. What are the causes? This condition is caused by a problem with the muscle between the esophagus and the stomach (lower esophageal sphincter, or LES). Normally, the LES muscle closes after food passes through the esophagus to the stomach. When the LES is weakened or abnormal, it does not close properly, and that allows food and stomach acid to go back up into the esophagus. The LES can be weakened by certain dietary substances, medicines, and medical conditions, including: Tobacco use. Pregnancy. Having a hiatal hernia. Alcohol use. Certain foods and beverages, such as coffee, chocolate, onions, and peppermint. What increases the risk? You are more likely to develop this condition if you: Have an increased body weight. Have a connective tissue disorder. Take NSAIDs, such as ibuprofen. What are the  signs or symptoms? Symptoms of this condition include: Heartburn. Difficult or painful swallowing and the feeling of having a lump in the throat. A bitter taste in the mouth. Bad breath and having a large amount of saliva. Having an upset or bloated stomach and belching. Chest pain. Different conditions can cause chest pain. Make sure you see your health care provider if you experience chest pain. Shortness of breath or wheezing. Ongoing (chronic) cough or a nighttime cough. Wearing away of tooth enamel. Weight loss. How is this diagnosed? This condition may be diagnosed based on a medical history and a physical exam. To determine if you have mild or severe GERD, your health care provider may also monitor how you respond to treatment. You may also have tests, including: A test to examine your stomach and esophagus with a small camera (endoscopy). A test that measures the acidity level in your esophagus. A test that measures how much pressure is on your esophagus. A barium swallow or modified barium swallow test to show the shape, size, and functioning of your esophagus. How is this treated? Treatment for this condition may vary depending on how severe your symptoms are. Your health care provider may recommend: Changes to your diet. Medicine. Surgery. The goal of treatment is to help relieve your symptoms and to prevent complications. Follow these instructions at home: Eating and drinking  Follow a diet as recommended by your health care provider. This may involve avoiding foods and drinks such as: Coffee and tea, with or without caffeine. Drinks that contain alcohol. Energy drinks and sports drinks. Carbonated drinks or sodas. Chocolate and cocoa. Peppermint and mint flavorings. Garlic and onions.  Horseradish. Spicy and acidic foods, including peppers, chili powder, curry powder, vinegar, hot sauces, and barbecue sauce. Citrus fruit juices and citrus fruits, such as oranges,  lemons, and limes. Tomato-based foods, such as red sauce, chili, salsa, and pizza with red sauce. Fried and fatty foods, such as donuts, french fries, potato chips, and high-fat dressings. High-fat meats, such as hot dogs and fatty cuts of red and white meats, such as rib eye steak, sausage, ham, and bacon. High-fat dairy items, such as whole milk, butter, and cream cheese. Eat small, frequent meals instead of large meals. Avoid drinking large amounts of liquid with your meals. Avoid eating meals during the 2-3 hours before bedtime. Avoid lying down right after you eat. Do not exercise right after you eat. Lifestyle  Do not use any products that contain nicotine or tobacco. These products include cigarettes, chewing tobacco, and vaping devices, such as e-cigarettes. If you need help quitting, ask your health care provider. Try to reduce your stress by using methods such as yoga or meditation. If you need help reducing stress, ask your health care provider. If you are overweight, reduce your weight to an amount that is healthy for you. Ask your health care provider for guidance about a safe weight loss goal. General instructions Pay attention to any changes in your symptoms. Take over-the-counter and prescription medicines only as told by your health care provider. Do not take aspirin, ibuprofen, or other NSAIDs unless your health care provider told you to take these medicines. Wear loose-fitting clothing. Do not wear anything tight around your waist that causes pressure on your abdomen. Raise (elevate) the head of your bed about 6 inches (15 cm). You can use a wedge to do this. Avoid bending over if this makes your symptoms worse. Keep all follow-up visits. This is important. Contact a health care provider if: You have: New symptoms. Unexplained weight loss. Difficulty swallowing or it hurts to swallow. Wheezing or a persistent cough. A hoarse voice. Your symptoms do not improve with  treatment. Get help right away if: You have sudden pain in your arms, neck, jaw, teeth, or back. You suddenly feel sweaty, dizzy, or light-headed. You have chest pain or shortness of breath. You vomit and the vomit is green, yellow, or black, or it looks like blood or coffee grounds. You faint. You have stool that is red, bloody, or black. You cannot swallow, drink, or eat. These symptoms may represent a serious problem that is an emergency. Do not wait to see if the symptoms will go away. Get medical help right away. Call your local emergency services (911 in the [place] Do not drive yourself to the hospital. Summary Gastroesophageal reflux happens when acid from the stomach flows up into the esophagus. GERD is a disease in which the reflux happens often, causes frequent or severe symptoms, or causes problems such as damage to the esophagus. Treatment for this condition may vary depending on how severe your symptoms are. Your health care provider may recommend diet and lifestyle changes, medicine, or surgery. Contact a health care provider if you have new or worsening symptoms. Take over-the-counter and prescription medicines only as told by your health care provider. Do not take aspirin, ibuprofen, or other NSAIDs unless your health care provider told you to do so. Keep all follow-up visits as told by your health care provider. This is important. This information is not intended to replace advice given to you by your health care provider. Make sure you discuss any  questions you have with your health care provider. Document Revised: 05/03/2020 Document Reviewed: 05/03/2020 Elsevier Patient Education  2022 [employer]  Follow up after procedures    I appreciate the  opportunity to care for you  Thank You   Marsa Aris , MD

## 2021-12-06 ENCOUNTER — Encounter: Payer: Self-pay | Admitting: Gastroenterology

## 2021-12-19 ENCOUNTER — Ambulatory Visit (AMBULATORY_SURGERY_CENTER): Payer: PPO | Admitting: Gastroenterology

## 2021-12-19 ENCOUNTER — Encounter: Payer: Self-pay | Admitting: Gastroenterology

## 2021-12-19 VITALS — BP 130/67 | HR 84 | Temp 97.1°F | Resp 21 | Ht 62.0 in | Wt 137.0 lb

## 2021-12-19 DIAGNOSIS — K2100 Gastro-esophageal reflux disease with esophagitis, without bleeding: Secondary | ICD-10-CM

## 2021-12-19 DIAGNOSIS — K219 Gastro-esophageal reflux disease without esophagitis: Secondary | ICD-10-CM

## 2021-12-19 DIAGNOSIS — Z800 Family history of malignant neoplasm of digestive organs: Secondary | ICD-10-CM | POA: Diagnosis not present

## 2021-12-19 DIAGNOSIS — K449 Diaphragmatic hernia without obstruction or gangrene: Secondary | ICD-10-CM | POA: Diagnosis not present

## 2021-12-19 DIAGNOSIS — K2970 Gastritis, unspecified, without bleeding: Secondary | ICD-10-CM | POA: Diagnosis not present

## 2021-12-19 DIAGNOSIS — D509 Iron deficiency anemia, unspecified: Secondary | ICD-10-CM | POA: Diagnosis not present

## 2021-12-19 DIAGNOSIS — Z1211 Encounter for screening for malignant neoplasm of colon: Secondary | ICD-10-CM | POA: Diagnosis not present

## 2021-12-19 MED ORDER — LANSOPRAZOLE 30 MG PO CPDR: 30.0000 mg | DELAYED_RELEASE_CAPSULE | Freq: Every day | ORAL | 3 refills | Status: DC

## 2021-12-19 MED ORDER — SODIUM CHLORIDE 0.9 % IV SOLN: 500.0000 mL | Freq: Once | INTRAVENOUS | Status: DC

## 2021-12-19 NOTE — Op Note (Addendum)
[facility] Patient Name: Suzanne Coleman Procedure Date: 12/19/2021 3:39 PM MRN: [id number] Endoscopist: Mauri Pole , MD Age: 76 Referring MD:  Date of Birth: 11-28-1945 Gender: Female Account #: [id number] Procedure:                Upper GI endoscopy Indications:              Iron deficiency anemia due to suspected upper                            gastrointestinal bleeding, Esophageal reflux,                            Abnormal CT of the GI tract, Hiatal hernia,                            Follow-up of hiatal hernia Medicines:                Monitored Anesthesia Care Procedure:                Pre-Anesthesia Assessment:                           - Prior to the procedure, a History and Physical                            was performed, and patient medications and                            allergies were reviewed. The patient's tolerance of                            previous anesthesia was also reviewed. The risks                            and benefits of the procedure and the sedation                            options and risks were discussed with the patient.                            All questions were answered, and informed consent                            was obtained. Prior Anticoagulants: The patient has                            taken no previous anticoagulant or antiplatelet                            agents. ASA Grade Assessment: II - A patient with                            mild systemic disease. After reviewing the risks  and benefits, the patient was deemed in                            satisfactory condition to undergo the procedure.                           After obtaining informed consent, the endoscope was                            passed under direct vision. Throughout the                            procedure, the patient's blood pressure, pulse, and                            oxygen saturations were monitored  continuously. The                            Endoscope was introduced through the mouth, and                            advanced to the second part of duodenum. The upper                            GI endoscopy was accomplished without difficulty.                            The patient tolerated the procedure well. Scope In: Scope Out: Findings:                 LA Grade B (one or more mucosal breaks greater than                            5 mm, not extending between the tops of two mucosal                            folds) esophagitis with no bleeding was found 35 to                            36 cm from the incisors.                           A large hiatal hernia was present.                           Scattered moderate inflammation with hemorrhage                            characterized by erosions and erythema was found in                            the entire examined stomach. Biopsies were taken  with a cold forceps for Helicobacter pylori testing.                           Patchy mildly erythematous mucosa without active                            bleeding was found in the duodenal bulb, in the                            first portion of the duodenum and in the second                            portion of the duodenum. Biopsies for histology                            were taken with a cold forceps for evaluation of                            celiac disease. Complications:            No immediate complications. Estimated Blood Loss:     Estimated blood loss was minimal. Impression:               - LA Grade B reflux esophagitis with no bleeding.                           - Large hiatal hernia.                           - Gastritis with hemorrhage. Biopsied.                           - Erythematous duodenopathy. Biopsied. Recommendation:           - Patient has a contact number available for                            emergencies. The signs and symptoms of  potential                            delayed complications were discussed with the                            patient. Return to normal activities tomorrow.                            Written discharge instructions were provided to the                            patient.                           - Resume previous diet.                           - Continue present medications.                           -  Await pathology results.                           - Follow an antireflux regimen.                           - Use Prevacid (lansoprazole) 30 mg PO daily. Rx                            for 90 days with 3 refills                           - Refer to a CT surgeon (Dr.Lightfoot) at                            appointment to be scheduled to discuss repair of                            large hiatal hernia.                           - Return to GI office in 3 months. Please call to                            schedule appointment. Mauri Pole, MD 12/19/2021 4:16:39 PM This report has been signed electronically.

## 2021-12-19 NOTE — Progress Notes (Signed)
Please refer to office visit note 11/29/21. No additional changes in H&P °Patient is appropriate for planned procedure(s) and anesthesia in an ambulatory setting ° °K. Veena Nasri Boakye , MD °[phone]   °

## 2021-12-19 NOTE — Op Note (Addendum)
[facility] Patient Name: Suzanne Coleman Procedure Date: 12/19/2021 3:39 PM MRN: [id number] Endoscopist: Napoleon Form , MD Age: 76 Referring MD:  Date of Birth: 04-Jan-1946 Gender: Female Account #: [id number] Procedure:                Colonoscopy Indications:              Screening in patient at increased risk: Family                            history of 1st-degree relative with colorectal                            cancer Medicines:                Monitored Anesthesia Care Procedure:                Pre-Anesthesia Assessment:                           - Prior to the procedure, a History and Physical                            was performed, and patient medications and                            allergies were reviewed. The patient's tolerance of                            previous anesthesia was also reviewed. The risks                            and benefits of the procedure and the sedation                            options and risks were discussed with the patient.                            All questions were answered, and informed consent                            was obtained. Prior Anticoagulants: The patient has                            taken no previous anticoagulant or antiplatelet                            agents. ASA Grade Assessment: II - A patient with                            mild systemic disease. After reviewing the risks                            and benefits, the patient was deemed in  satisfactory condition to undergo the procedure.                           After obtaining informed consent, the colonoscope                            was passed under direct vision. Throughout the                            procedure, the patient's blood pressure, pulse, and                            oxygen saturations were monitored continuously. The                            Olympus PCF-[id number]) Colonoscope was                             introduced through the anus and advanced to the the                            cecum, identified by appendiceal orifice and                            ileocecal valve. The colonoscopy was performed                            without difficulty. The patient tolerated the                            procedure well. The quality of the bowel                            preparation was excellent. The ileocecal valve,                            appendiceal orifice, and rectum were photographed. Scope In: 3:54:16 PM Scope Out: 4:04:03 PM Scope Withdrawal Time: 0 hours 6 minutes 57 seconds  Total Procedure Duration: 0 hours 9 minutes 47 seconds  Findings:                 The perianal and digital rectal examinations were                            normal.                           Scattered small and large-mouthed diverticula were                            found in the sigmoid colon, descending colon,                            transverse colon and ascending colon.  Non-bleeding external and internal hemorrhoids were                            found during retroflexion. The hemorrhoids were                            large. Complications:            No immediate complications. Estimated Blood Loss:     Estimated blood loss was minimal. Impression:               - Moderate diverticulosis in the sigmoid colon, in                            the descending colon, in the transverse colon and                            in the ascending colon.                           - Non-bleeding external and internal hemorrhoids.                           - No specimens collected. Recommendation:           - Patient has a contact number available for                            emergencies. The signs and symptoms of potential                            delayed complications were discussed with the                            patient. Return to normal activities tomorrow.                             Written discharge instructions were provided to the                            patient.                           - Resume previous diet.                           - Continue present medications.                           - No repeat colonoscopy due to age. Napoleon Form, MD 12/19/2021 4:09:45 PM This report has been signed electronically.

## 2021-12-19 NOTE — Progress Notes (Signed)
Called to room to assist during endoscopic procedure.  Patient ID and intended procedure confirmed with present staff. Received instructions for my participation in the procedure from the performing physician.  

## 2021-12-19 NOTE — Patient Instructions (Signed)
Upper-Follow an anti-reflux regimen. Use Prevacid (Lansoprazole) 30 mg by mouth daily. Refer to a CT Surgeon (Dr.Lightfoot) to discuss repair of large hiatal hernia.  Lower-Resume previous diet and medications. No repeat Colonoscopy due to age. YOU HAD AN ENDOSCOPIC PROCEDURE TODAY AT [facility]:   Refer to the procedure report that was given to you for any specific questions about what was found during the examination.  If the procedure report does not answer your questions, please call your gastroenterologist to clarify.  If you requested that your care partner not be given the details of your procedure findings, then the procedure report has been included in a sealed envelope for you to review at your convenience later.  YOU SHOULD EXPECT: Some feelings of bloating in the abdomen. Passage of more gas than usual.  Walking can help get rid of the air that was put into your GI tract during the procedure and reduce the bloating. If you had a lower endoscopy (such as a colonoscopy or flexible sigmoidoscopy) you may notice spotting of blood in your stool or on the toilet paper. If you underwent a bowel prep for your procedure, you may not have a normal bowel movement for a few days.  Please Note:  You might notice some irritation and congestion in your nose or some drainage.  This is from the oxygen used during your procedure.  There is no need for concern and it should clear up in a day or so.  SYMPTOMS TO REPORT IMMEDIATELY:  Following lower endoscopy (colonoscopy or flexible sigmoidoscopy):  Excessive amounts of blood in the stool  Significant tenderness or worsening of abdominal pains  Swelling of the abdomen that is new, acute  Fever of 100F or higher  Following upper endoscopy (EGD)  Vomiting of blood or coffee ground material  New chest pain or pain under the shoulder blades  Painful or persistently difficult swallowing  New shortness of breath  Fever of 100F or  higher  Black, tarry-looking stools  For urgent or emergent issues, a gastroenterologist can be reached at any hour by calling [phone]. Do not use MyChart messaging for urgent concerns.    DIET:  We do recommend a small meal at first, but then you may proceed to your regular diet.  Drink plenty of fluids but you should avoid alcoholic beverages for 24 hours.  ACTIVITY:  You should plan to take it easy for the rest of today and you should NOT DRIVE or use heavy machinery until tomorrow (because of the sedation medicines used during the test).    FOLLOW UP: Our staff will call the number listed on your records 48-72 hours following your procedure to check on you and address any questions or concerns that you may have regarding the information given to you following your procedure. If we do not reach you, we will leave a message.  We will attempt to reach you two times.  During this call, we will ask if you have developed any symptoms of COVID 19. If you develop any symptoms (ie: fever, flu-like symptoms, shortness of breath, cough etc.) before then, please call [phone].  If you test positive for Covid 19 in the 2 weeks post procedure, please call and report this information to [place].    If any biopsies were taken you will be contacted by phone or by letter within the next 1-3 weeks.  Please call us at [phone] if you have not heard about the biopsies in 3  weeks.    SIGNATURES/CONFIDENTIALITY: You and/or your care partner have signed paperwork which will be entered into your electronic medical record.  These signatures attest to the fact that that the information above on your After Visit Summary has been reviewed and is understood.  Full responsibility of the confidentiality of this discharge information lies with you and/or your care-partner.

## 2021-12-19 NOTE — Progress Notes (Signed)
Vs by DT in adm 

## 2021-12-19 NOTE — Progress Notes (Signed)
A and O x3. Report to RN. Tolerated MAC anesthesia well.Teeth unchanged after procedure. 

## 2021-12-20 ENCOUNTER — Other Ambulatory Visit: Payer: Self-pay

## 2021-12-20 DIAGNOSIS — K449 Diaphragmatic hernia without obstruction or gangrene: Secondary | ICD-10-CM

## 2021-12-21 ENCOUNTER — Telehealth: Payer: Self-pay

## 2021-12-21 ENCOUNTER — Telehealth: Payer: Self-pay | Admitting: Gastroenterology

## 2021-12-21 NOTE — Telephone Encounter (Signed)
°  Follow up Call-  Call back number 12/19/2021  Post procedure Call Back phone  # [phone]  Permission to leave phone message Yes  Some recent data might be hidden     Patient questions:  Do you have a fever, pain , or abdominal swelling? No. Pain Score  0 *  Have you tolerated food without any problems? Yes.    Have you been able to return to your normal activities? Yes.    Do you have any questions about your discharge instructions: Diet   No. Medications  No. Follow up visit  No.  Do you have questions or concerns about your Care? No.  Actions: * If pain score is 4 or above: No action needed, pain <4.  Have you developed a fever since your procedure? no  2.   Have you had an respiratory symptoms (SOB or cough) since your procedure? no  3.   Have you tested positive for COVID 19 since your procedure no  4.   Have you had any family members/close contacts diagnosed with the COVID 19 since your procedure?  no   If yes to any of these questions please route to Joylene John, RN and Joella Prince, RN

## 2021-12-21 NOTE — Telephone Encounter (Signed)
Pt received a call to schedule appt with Dr. Cliffton Asters and also received a call to schedule CT of chest. Pt confused as to why she had gotten 2 phone called. Pt will call [phone] as they left message to schedule CT. Pt not happy with our office as we have been confusing her. Tried to explain that this RN is not Dr. Elana Alm regular nurse and was trying to help figure out what was going on but pt stated she "did not want to hear that." Pt states she is calling [facility] scheduling.

## 2021-12-21 NOTE — Telephone Encounter (Signed)
Inbound call from patient stating she had a message in regards to CT scan but not sure who called.  Please advise.

## 2021-12-27 ENCOUNTER — Encounter: Payer: Self-pay | Admitting: Gastroenterology

## 2021-12-27 ENCOUNTER — Other Ambulatory Visit: Payer: Self-pay

## 2021-12-29 ENCOUNTER — Telehealth: Payer: Self-pay

## 2021-12-29 NOTE — Telephone Encounter (Signed)
-----   Message from April H Pait sent at 12/26/2021 12:43 PM EST ----- fyi ----- Message ----- From: Lorenza Cambridge Sent: 12/26/2021   9:39 AM EST To: April H Pait  12/26/21 - patient declined to schedule. ----- Message ----- From: Sinda Du H Sent: 12/20/2021   4:03 PM EST To: Lorenza Cambridge   ----- Message ----- From: Evalee Jefferson, LPN Sent: 2/77/8242   3:48 PM EST To: April H Pait, Marylynn Pearson  This patient needs a CT of the chest wo contrast please. She may go to any location. Thanks [employer]

## 2022-01-04 DIAGNOSIS — H1031 Unspecified acute conjunctivitis, right eye: Secondary | ICD-10-CM | POA: Diagnosis not present

## 2022-01-09 ENCOUNTER — Other Ambulatory Visit: Payer: Self-pay

## 2022-01-09 ENCOUNTER — Ambulatory Visit (HOSPITAL_BASED_OUTPATIENT_CLINIC_OR_DEPARTMENT_OTHER)
Admission: RE | Admit: 2022-01-09 | Discharge: 2022-01-09 | Disposition: A | Discharge status: Home / Self Care | Payer: PPO | Source: Ambulatory Visit | Attending: Gastroenterology | Admitting: Gastroenterology

## 2022-01-09 ENCOUNTER — Encounter (HOSPITAL_BASED_OUTPATIENT_CLINIC_OR_DEPARTMENT_OTHER): Payer: Self-pay

## 2022-01-09 DIAGNOSIS — J9811 Atelectasis: Secondary | ICD-10-CM | POA: Diagnosis not present

## 2022-01-09 DIAGNOSIS — K449 Diaphragmatic hernia without obstruction or gangrene: Secondary | ICD-10-CM | POA: Diagnosis not present

## 2022-01-09 DIAGNOSIS — R911 Solitary pulmonary nodule: Secondary | ICD-10-CM | POA: Diagnosis not present

## 2022-01-10 DIAGNOSIS — H1131 Conjunctival hemorrhage, right eye: Secondary | ICD-10-CM | POA: Diagnosis not present

## 2022-01-18 NOTE — Progress Notes (Signed)
? ?   ?[address] ?      [place] [id number] ?            [phone]   ?                 ?Suzanne EstelleSylvia M Coleman ?[facility] Medical Record #[id number] ?Date of Birth: Jul 14, 1946 ? ?Referring: Napoleon FormNandigam, Kavitha V, MD ?Primary Care: Merri BrunettePharr, Walter, MD ?Primary Cardiologist: None ? ?Chief Complaint:    ?Chief Complaint  ?Patient presents with  ? Hiatal Hernia  ?  Surgical consult, Chest CT 01/09/22  ? ? ?History of Present Illness:    ?Suzanne EstelleSylvia M Coleman 76 y.o. female referred for surgical evaluation of a large paraesophageal hernia.  She has been followed by Dr. Lavon PaganiniNandigam, and recently underwent an upper endoscopy which showed the hernia as well as esophagitis and some gastritis.  In regards to the patient's symptoms, she states that she denies any reflux, and denies any dysphagia or odynophagia.  For the past several years she has however reduced the amount of food that she eats and when sitting, and religiously avoids certain foods which have made her either regurgitate or sick in the past.  Her weight has been stable.  She denies any chronic cough or upper respiratory infections. ?  ?She has experienced some shortness of breath with exertion.  This occasionally is worse after meals. ? ? ?Zubrod Score: ?At the time of surgery this patient?s most appropriate activity status/level should be described as: ?[x]     0    Normal activity, no symptoms ?[]     1    Restricted in physical strenuous activity but ambulatory, able to do out light work ?[]     2    Ambulatory and capable of self care, unable to do work activities, up and about               >50 % of waking hours                              ?[]     3    Only limited self care, in bed greater than 50% of waking hours ?[]     4    Completely disabled, no self care, confined to bed or chair ?[]     5    Moribund ? ? ?Past Medical History:  ?Diagnosis Date  ? Blood transfusion without reported diagnosis   ? Colon polyps   ? GERD (gastroesophageal reflux disease)    ? Headache   ? Hiatal hernia   ? Low blood potassium   ? MRSA carrier   ? Osteopenia   ? Vision abnormalities   ? cataracts  ? ? ?Past Surgical History:  ?Procedure Laterality Date  ? CATARACT EXTRACTION, BILATERAL Bilateral   ? COLONOSCOPY    ? SVD    ? x2  ? ? ?Family History  ?Problem Relation Age of Onset  ? Dementia Mother   ? Cancer Mother   ?     unsure of type, METASTATIC  ? Colon cancer Father   ? Lung cancer Father   ? Dementia Father   ? Leukemia Sister   ? Brain cancer Sister   ?     tumor on the  brain  ? Dementia Maternal Grandmother   ? Breast cancer Paternal Aunt   ? Breast cancer Paternal Aunt   ? Breast cancer Cousin   ? Breast cancer Cousin   ? ? ? ?  Social History  ? ?Tobacco Use  ?Smoking Status Former  ? Types: Cigarettes  ? Quit date: 61  ? Years since quitting: 46.2  ?Smokeless Tobacco Never  ?Tobacco Comments  ? Social weekend smoker  ?  ?Social History  ? ?Substance and Sexual Activity  ?Alcohol Use No  ? Alcohol/week: 0.0 standard drinks  ? ? ? ?Allergies  ?Allergen Reactions  ? Codeine   ?  REACTION: hives, vomiting  ? Sulfonamide Derivatives   ?  REACTION: hives, vomiting  ? ? ?Current Outpatient Medications  ?Medication Sig Dispense Refill  ? alendronate (FOSAMAX) 70 MG tablet Take 70 mg by mouth once a week.    ? aspirin 81 MG chewable tablet Chew 1 tablet by mouth daily.    ? CALCIUM PO Take 500 mg by mouth daily.    ? Cholecalciferol (VITAMIN D3 PO) Take 500 Units by mouth daily.    ? Ferrous Sulfate (IRON) 325 (65 Fe) MG TABS Take 1 tablet by mouth daily.    ? lansoprazole (PREVACID) 30 MG capsule Take 1 capsule (30 mg total) by mouth daily at 12 noon. 90 capsule 3  ? Multiple Vitamin (MULTIVITAMIN) tablet Take 1 tablet by mouth daily.    ? potassium chloride (K-DUR) 10 MEQ tablet TK 3 TS PO D tid  3  ? vitamin B-12 (CYANOCOBALAMIN) 500 MCG tablet Take 500 mcg by mouth daily.    ? Zinc 50 MG TABS Take 1 tablet by mouth daily.    ? ?No current facility-administered medications for  this visit.  ? ? ?Review of Systems  ?Respiratory:  Negative for cough and shortness of breath.   ?Cardiovascular:  Negative for chest pain.  ?Gastrointestinal:  Positive for vomiting. Negative for abdominal pain, heartburn and nausea.  ? ? ?PHYSICAL EXAMINATION: ?BP (!) 164/82   Pulse 80   Resp 20   Ht 5\' 2"  (1.575 m)   Wt 137 lb (62.1 kg)   SpO2 98% Comment: RA  BMI 25.06 kg/m?  ?Physical Exam ?Constitutional:   ?   General: She is not in acute distress. ?   Appearance: Normal appearance. She is normal weight. She is not ill-appearing.  ?Eyes:  ?   Extraocular Movements: Extraocular movements intact.  ?Cardiovascular:  ?   Rate and Rhythm: Normal rate.  ?Pulmonary:  ?   Effort: Pulmonary effort is normal. No respiratory distress.  ?Abdominal:  ?   General: Abdomen is flat. There is no distension.  ?Musculoskeletal:     ?   General: Normal range of motion.  ?   Cervical back: Normal range of motion.  ?Skin: ?   General: Skin is warm and dry.  ?Neurological:  ?   General: No focal deficit present.  ?   Mental Status: She is alert and oriented to person, place, and time.  ? ? ?Diagnostic Studies & Laboratory data: ?   ?CT Scan: 01/2022 ?Mediastinum/Nodes: There are no enlarged mediastinal or hilar lymph ?nodes. The visualized thyroid gland is within normal limits. The ?esophagus is nondilated. Again seen is a large hiatal hernia with ?intrathoracic stomach. There is also small amount of nondilated ?colon within the hernia. Appearance is similar to the prior study. ?No dilated bowel loops are visualized. ?  ?Lungs/Pleura: There is minimal compressive atelectasis in the ?bilateral lower lobes. The lungs are otherwise clear. There is no ?pleural effusion or pneumothorax. Small peripheral areas of scarring ?in the right upper lobe is unchanged. There is a 3 mm peripheral ?nodule in  the right upper lobe image 4/49 which is unchanged from ?prior. ? ?EGD/EUS:  ?Feb 2023 ?- LA Grade B reflux esophagitis with no  bleeding. ?- Large hiatal hernia. ?- Gastritis with hemorrhage. Biopsied. ?- Erythematous duodenopathy. Biopsied. ? ? ? ?  ?I have independently reviewed the above radiology studies  and reviewed the findings with the patient.  ? ?Recent Lab Findings: ?No results found for: WBC, HGB, HCT, PLT, GLUCOSE, CHOL, TRIG, HDL, LDLDIRECT, LDLCALC, ALT, AST, NA, K, CL, CREATININE, BUN, CO2, TSH, INR, GLUF, HGBA1C ? ? ? ? ? ? ? ? ?Assessment / Plan:   ?76 yo female, large hiatal hernia.  Upper endoscopy has shown grade B esophagitis as well as gastritis.  I had a very long discussion with this patient and her family member in regards to surgical repair.  Although she claims to be asymptomatic, I do not think that this is entirely the case given the size of her defect, and the fact that she has changed her eating habits over the last several years.  Additionally, on cross-sectional imaging her stomach appears very tortuous, and there was concern for volvulus.  She would like some time to consider her options and discuss this with other family members.  She will give [place] a call once she has made a decision. ?   ? ?I  spent 60 minutes with the patient face to face counseling and coordination of care.   ? ?Corliss Skains ?01/20/2022 7:37 PM ? ? ? ? ? ? ? ?

## 2022-01-20 ENCOUNTER — Other Ambulatory Visit: Payer: Self-pay

## 2022-01-20 ENCOUNTER — Institutional Professional Consult (permissible substitution): Payer: PPO | Admitting: Thoracic Surgery (Cardiothoracic Vascular Surgery)

## 2022-01-20 VITALS — BP 164/82 | HR 80 | Resp 20 | Ht 62.0 in | Wt 137.0 lb

## 2022-01-20 DIAGNOSIS — E559 Vitamin D deficiency, unspecified: Secondary | ICD-10-CM | POA: Insufficient documentation

## 2022-01-20 DIAGNOSIS — K449 Diaphragmatic hernia without obstruction or gangrene: Secondary | ICD-10-CM | POA: Diagnosis not present

## 2022-01-20 DIAGNOSIS — Z882 Allergy status to sulfonamides status: Secondary | ICD-10-CM | POA: Insufficient documentation

## 2022-01-20 DIAGNOSIS — Z800 Family history of malignant neoplasm of digestive organs: Secondary | ICD-10-CM | POA: Insufficient documentation

## 2022-01-20 DIAGNOSIS — E876 Hypokalemia: Secondary | ICD-10-CM | POA: Insufficient documentation

## 2022-01-20 DIAGNOSIS — M654 Radial styloid tenosynovitis [de Quervain]: Secondary | ICD-10-CM | POA: Insufficient documentation

## 2022-01-20 DIAGNOSIS — M810 Age-related osteoporosis without current pathological fracture: Secondary | ICD-10-CM | POA: Insufficient documentation

## 2022-01-20 DIAGNOSIS — Z8742 Personal history of other diseases of the female genital tract: Secondary | ICD-10-CM | POA: Insufficient documentation

## 2022-01-20 DIAGNOSIS — Z8614 Personal history of Methicillin resistant Staphylococcus aureus infection: Secondary | ICD-10-CM | POA: Insufficient documentation

## 2022-01-20 DIAGNOSIS — L811 Chloasma: Secondary | ICD-10-CM | POA: Insufficient documentation

## 2022-07-24 DIAGNOSIS — M810 Age-related osteoporosis without current pathological fracture: Secondary | ICD-10-CM | POA: Diagnosis not present

## 2022-08-01 DIAGNOSIS — I10 Essential (primary) hypertension: Secondary | ICD-10-CM | POA: Diagnosis not present

## 2022-08-01 DIAGNOSIS — E876 Hypokalemia: Secondary | ICD-10-CM | POA: Diagnosis not present

## 2022-08-01 DIAGNOSIS — Z0000 Encounter for general adult medical examination without abnormal findings: Secondary | ICD-10-CM | POA: Diagnosis not present

## 2022-08-10 DIAGNOSIS — E876 Hypokalemia: Secondary | ICD-10-CM | POA: Diagnosis not present

## 2022-08-10 DIAGNOSIS — Z8614 Personal history of Methicillin resistant Staphylococcus aureus infection: Secondary | ICD-10-CM | POA: Diagnosis not present

## 2022-08-10 DIAGNOSIS — M810 Age-related osteoporosis without current pathological fracture: Secondary | ICD-10-CM | POA: Diagnosis not present

## 2022-08-10 DIAGNOSIS — R030 Elevated blood-pressure reading, without diagnosis of hypertension: Secondary | ICD-10-CM | POA: Diagnosis not present

## 2022-08-10 DIAGNOSIS — K209 Esophagitis, unspecified without bleeding: Secondary | ICD-10-CM | POA: Diagnosis not present

## 2022-08-10 DIAGNOSIS — Z0000 Encounter for general adult medical examination without abnormal findings: Secondary | ICD-10-CM | POA: Diagnosis not present

## 2022-08-10 DIAGNOSIS — Z23 Encounter for immunization: Secondary | ICD-10-CM | POA: Diagnosis not present

## 2022-08-25 DIAGNOSIS — M810 Age-related osteoporosis without current pathological fracture: Secondary | ICD-10-CM | POA: Diagnosis not present

## 2022-08-25 DIAGNOSIS — K2210 Ulcer of esophagus without bleeding: Secondary | ICD-10-CM | POA: Diagnosis not present

## 2022-09-07 DIAGNOSIS — M810 Age-related osteoporosis without current pathological fracture: Secondary | ICD-10-CM | POA: Diagnosis not present

## 2022-10-02 ENCOUNTER — Other Ambulatory Visit: Payer: Self-pay | Admitting: Internal Medicine

## 2022-10-02 DIAGNOSIS — Z1231 Encounter for screening mammogram for malignant neoplasm of breast: Secondary | ICD-10-CM

## 2022-11-28 ENCOUNTER — Ambulatory Visit
Admission: RE | Admit: 2022-11-28 | Discharge: 2022-11-28 | Disposition: A | Discharge status: Home / Self Care | Payer: PPO | Source: Ambulatory Visit | Attending: Internal Medicine | Admitting: Internal Medicine

## 2022-11-28 ENCOUNTER — Ambulatory Visit: Payer: PPO

## 2022-11-28 DIAGNOSIS — Z1231 Encounter for screening mammogram for malignant neoplasm of breast: Secondary | ICD-10-CM | POA: Diagnosis not present

## 2022-11-28 DIAGNOSIS — Z01419 Encounter for gynecological examination (general) (routine) without abnormal findings: Secondary | ICD-10-CM | POA: Diagnosis not present

## 2022-11-28 DIAGNOSIS — N952 Postmenopausal atrophic vaginitis: Secondary | ICD-10-CM | POA: Diagnosis not present

## 2022-11-28 DIAGNOSIS — Z6825 Body mass index (BMI) 25.0-25.9, adult: Secondary | ICD-10-CM | POA: Diagnosis not present

## 2022-11-28 DIAGNOSIS — M8580 Other specified disorders of bone density and structure, unspecified site: Secondary | ICD-10-CM | POA: Diagnosis not present

## 2022-12-28 ENCOUNTER — Other Ambulatory Visit: Payer: Self-pay | Admitting: Gastroenterology

## 2023-03-27 DIAGNOSIS — H903 Sensorineural hearing loss, bilateral: Secondary | ICD-10-CM | POA: Diagnosis not present

## 2023-07-31 DIAGNOSIS — R519 Headache, unspecified: Secondary | ICD-10-CM | POA: Diagnosis not present

## 2023-08-09 DIAGNOSIS — R030 Elevated blood-pressure reading, without diagnosis of hypertension: Secondary | ICD-10-CM | POA: Diagnosis not present

## 2023-08-09 DIAGNOSIS — M810 Age-related osteoporosis without current pathological fracture: Secondary | ICD-10-CM | POA: Diagnosis not present

## 2023-08-14 DIAGNOSIS — K449 Diaphragmatic hernia without obstruction or gangrene: Secondary | ICD-10-CM | POA: Diagnosis not present

## 2023-08-14 DIAGNOSIS — R82998 Other abnormal findings in urine: Secondary | ICD-10-CM | POA: Diagnosis not present

## 2023-08-14 DIAGNOSIS — Z0000 Encounter for general adult medical examination without abnormal findings: Secondary | ICD-10-CM | POA: Diagnosis not present

## 2023-08-14 DIAGNOSIS — Z23 Encounter for immunization: Secondary | ICD-10-CM | POA: Diagnosis not present

## 2023-08-14 DIAGNOSIS — E559 Vitamin D deficiency, unspecified: Secondary | ICD-10-CM | POA: Diagnosis not present

## 2023-08-14 DIAGNOSIS — M810 Age-related osteoporosis without current pathological fracture: Secondary | ICD-10-CM | POA: Diagnosis not present

## 2023-08-14 DIAGNOSIS — E876 Hypokalemia: Secondary | ICD-10-CM | POA: Diagnosis not present

## 2023-08-22 ENCOUNTER — Other Ambulatory Visit: Payer: Self-pay | Admitting: Internal Medicine

## 2023-08-22 DIAGNOSIS — Z1231 Encounter for screening mammogram for malignant neoplasm of breast: Secondary | ICD-10-CM

## 2023-09-11 DIAGNOSIS — M810 Age-related osteoporosis without current pathological fracture: Secondary | ICD-10-CM | POA: Diagnosis not present

## 2023-11-30 ENCOUNTER — Ambulatory Visit
Admission: RE | Admit: 2023-11-30 | Discharge: 2023-11-30 | Disposition: A | Discharge status: Home / Self Care | Payer: PPO | Source: Ambulatory Visit | Attending: Internal Medicine | Admitting: Internal Medicine

## 2023-11-30 DIAGNOSIS — Z1231 Encounter for screening mammogram for malignant neoplasm of breast: Secondary | ICD-10-CM | POA: Diagnosis not present

## 2023-11-30 DIAGNOSIS — Z01419 Encounter for gynecological examination (general) (routine) without abnormal findings: Secondary | ICD-10-CM | POA: Diagnosis not present

## 2023-11-30 DIAGNOSIS — N952 Postmenopausal atrophic vaginitis: Secondary | ICD-10-CM | POA: Diagnosis not present

## 2023-11-30 DIAGNOSIS — Z6825 Body mass index (BMI) 25.0-25.9, adult: Secondary | ICD-10-CM | POA: Diagnosis not present

## 2023-12-12 ENCOUNTER — Other Ambulatory Visit: Payer: Self-pay | Admitting: Gastroenterology

## 2024-01-01 ENCOUNTER — Telehealth: Payer: Self-pay | Admitting: Gastroenterology

## 2024-01-01 ENCOUNTER — Other Ambulatory Visit: Payer: Self-pay | Admitting: Gastroenterology

## 2024-01-01 MED ORDER — LANSOPRAZOLE 30 MG PO CPDR: 30.0000 mg | DELAYED_RELEASE_CAPSULE | Freq: Every day | ORAL | 0 refills | Status: DC

## 2024-01-01 NOTE — Telephone Encounter (Signed)
 Inbound call from patient, would like refill for lansoprazole sent to pharmacy, unable to verify pharmacy as patient told me to "be quiet" once asked.

## 2024-01-01 NOTE — Telephone Encounter (Signed)
 Lansoprazole sent to [facility] pharmacy [facility] and [place] sent to listed pharmacy   [facility] was unable to verify pharmacy

## 2024-01-16 NOTE — Telephone Encounter (Signed)
 PT is calling in regards to the lansoprzole that was sent in for her. She's not understanding that for proper refills she has to see a doctor. She hasn't been seen in 2 years. She is also concerned that she may not need this medication. Please advise

## 2024-01-16 NOTE — Telephone Encounter (Addendum)
 I returned patients call, She was confused about why she needed to be seen to get her medication.Lansoprazole Last OV 11/2021. I explained to her that she should be seen every two years for PPI medications. I also explained to her that on her Endoscopy report from 12/2021 Dr Lavon Paganini put on the report for HER to contact the office to schedule a 3 month follow up. She said she was unaware of that , "I didn't read the report".  She was complaining about the phone staff of "being rude" and "talking over her". She said she just decided to call her pharmacy instead. And also claims she did not get a  response back from our office  in Jan 2025 and was out of her medication. I told her to contact the office and ask to speak to clinical staff before giving up and just waiting on her pharmacy to reply. She has a follow up in April and will be here for that appointment. She can discuss whether or not she even still needs to take her Lansoprazole  and I told her to call back as needed but just ask to speak to the clinical staff of Dr Lavon Paganini and leave the message brief. Patient was satisfied  with our conversation.

## 2024-01-16 NOTE — Telephone Encounter (Signed)
 error

## 2024-01-18 DIAGNOSIS — I10 Essential (primary) hypertension: Secondary | ICD-10-CM | POA: Diagnosis not present

## 2024-02-07 ENCOUNTER — Encounter: Payer: Self-pay | Admitting: Gastroenterology

## 2024-02-07 ENCOUNTER — Ambulatory Visit: Payer: PPO | Admitting: Gastroenterology

## 2024-02-07 VITALS — BP 130/80 | HR 103 | Ht 62.0 in | Wt 139.2 lb

## 2024-02-07 DIAGNOSIS — K2100 Gastro-esophageal reflux disease with esophagitis, without bleeding: Secondary | ICD-10-CM

## 2024-02-07 DIAGNOSIS — K208 Other esophagitis without bleeding: Secondary | ICD-10-CM

## 2024-02-07 DIAGNOSIS — K449 Diaphragmatic hernia without obstruction or gangrene: Secondary | ICD-10-CM

## 2024-02-07 NOTE — Patient Instructions (Signed)
 Please follow up in 2 years or sooner if needed.  _______________________________________________________  If your blood pressure at your visit was 140/90 or greater, please contact your primary care physician to follow up on this.  _______________________________________________________  If you are age 76 or older, your body mass index should be between 23-30. Your Body mass index is 25.46 kg/m. If this is out of the aforementioned range listed, please consider follow up with your Primary Care Provider.  If you are age 24 or younger, your body mass index should be between 19-25. Your Body mass index is 25.46 kg/m. If this is out of the aformentioned range listed, please consider follow up with your Primary Care Provider.   ________________________________________________________  The [facility] providers would like to encourage you to use [facility] to communicate with providers for non-urgent requests or questions.  Due to long hold times on the telephone, sending your provider a message by [facility] may be a faster and more efficient way to get a response.  Please allow 48 business hours for a response.  Please remember that this is for non-urgent requests.  _______________________________________________________  Thank you for entrusting me with your care and for choosing [facility], [facility] - [facility].

## 2024-02-07 NOTE — Progress Notes (Signed)
 02/07/2024 Suzanne Coleman [id number] 12-19-1945   HISTORY OF PRESENT ILLNESS: This is a 78 year old female who is a patient of Dr. Elana Alm.  She is here today for follow-up in order to continue to get refills on her Prevacid.  She has not been seen here since her EGD in February 2023 as below.  Takes Prevacid 30 mg daily.  Says that she does well with that.  She had a large hiatal hernia and was sent to see Dr. Cliffton Asters.  She saw him in the office, but declined surgery and never followed up.  She has no GI complaints at this time.  Says she is asymptomatic in regards to her hernia.  EGD 12/2021:  - LA Grade B reflux esophagitis with no bleeding. - Large hiatal hernia. - Gastritis with hemorrhage. Biopsied. - Erythematous duodenopathy. Biopsied.  1. Surgical [P], duodenal-IDA-gastroduodenitis - DUODENAL MUCOSA WITH NORMAL VILLOUS ARCHITECTURE. - NO VILLOUS ATROPHY OR INCREASED INTRAEPITHELIAL LYMPHOCYTES. 2. Surgical [P], gastric antrum and gastric body-gastroduodenitis - UNREMARKABLE ANTRAL AND OXYNTIC MUCOSA. - NO HELICOBACTER PYLORI IDENTIFIED.   Past Medical History:  Diagnosis Date   Blood transfusion without reported diagnosis    Colon polyps    GERD (gastroesophageal reflux disease)    Headache    Hiatal hernia    Low blood potassium    MRSA carrier    Osteopenia    Vision abnormalities    cataracts   Past Surgical History:  Procedure Laterality Date   CATARACT EXTRACTION, BILATERAL Bilateral    COLONOSCOPY     SVD     x2    reports that she quit smoking about 48 years ago. Her smoking use included cigarettes. She has never used smokeless tobacco. She reports that she does not drink alcohol and does not use drugs. family history includes Brain cancer in her sister; Breast cancer in her cousin, cousin, paternal aunt, and paternal aunt; Cancer in her mother; Colon cancer in her father; Dementia in her father, maternal grandmother, and mother; Leukemia in her  sister; Lung cancer in her father. Allergies  Allergen Reactions   Codeine     REACTION: hives, vomiting   Sulfonamide Derivatives     REACTION: hives, vomiting      Outpatient Encounter Medications as of 02/07/2024  Medication Sig   alendronate (FOSAMAX) 70 MG tablet Take 70 mg by mouth once a week.   amLODipine (NORVASC) 2.5 MG tablet Take 2.5 mg by mouth daily.   aspirin 81 MG chewable tablet Chew 1 tablet by mouth daily.   CALCIUM PO Take 500 mg by mouth daily.   Cholecalciferol (VITAMIN D3 PO) Take 500 Units by mouth daily.   Ferrous Sulfate (IRON) 325 (65 Fe) MG TABS Take 1 tablet by mouth daily.   lansoprazole (PREVACID) 30 MG capsule TAKE 1 CAPSULE BY MOUTH DAILY AT 12 NOON   Multiple Vitamin (MULTIVITAMIN) tablet Take 1 tablet by mouth daily.   potassium chloride (K-DUR) 10 MEQ tablet TK 3 TS PO D tid   vitamin B-12 (CYANOCOBALAMIN) 500 MCG tablet Take 500 mcg by mouth daily.   Zinc 50 MG TABS Take 1 tablet by mouth daily.   zoledronic acid (RECLAST) 5 MG/100ML SOLN injection Inject 5 mg into the vein. Once a year   No facility-administered encounter medications on file as of 02/07/2024.    REVIEW OF SYSTEMS  : All other systems reviewed and negative except where noted in the History of Present Illness.   PHYSICAL EXAM: BP 130/80 (  Cuff Size: Normal)   Pulse (!) 103   Ht 5\' 2"  (1.575 m)   Wt 139 lb 3.2 oz (63.1 kg)   BMI 25.46 kg/m  General: Well developed female in no acute distress Head: Normocephalic and atraumatic Eyes:  Sclerae anicteric, conjunctiva pink. Ears: Normal auditory acuity Lungs: Clear throughout to auscultation; no W/R/R. Heart: Slightly tachy but regular rhythm. Musculoskeletal: Symmetrical with no gross deformities  Skin: No lesions on visible extremities Extremities: No edema  Neurological: Alert oriented x 4, grossly non-focal Psychological:  Alert and cooperative. Normal mood and affect  ASSESSMENT AND PLAN: *GERD/esophagitis/large hiatal  hernia: Had a large hiatal hernia resulting in grade B esophagitis on EGD in 2023.  She saw Dr. Cliffton Asters in regards to surgery, but declined at that time and never followed up.  She tells me that she is asymptomatic on Prevacid 30 mg daily.  She will continue that.  I suggested that she have an office visit follow-up with him to re-discuss.  Otherwise she will follow-up here in 18 months to 2 years for further refills on her Prevacid or sooner if needed.   CC:  Merri Brunette, MD

## 2024-02-12 DIAGNOSIS — K208 Other esophagitis without bleeding: Secondary | ICD-10-CM | POA: Insufficient documentation

## 2024-02-12 DIAGNOSIS — K449 Diaphragmatic hernia without obstruction or gangrene: Secondary | ICD-10-CM | POA: Insufficient documentation

## 2024-02-14 DIAGNOSIS — I10 Essential (primary) hypertension: Secondary | ICD-10-CM | POA: Diagnosis not present

## 2024-03-26 DIAGNOSIS — H90A31 Mixed conductive and sensorineural hearing loss, unilateral, right ear with restricted hearing on the contralateral side: Secondary | ICD-10-CM | POA: Diagnosis not present

## 2024-03-26 DIAGNOSIS — H90A12 Conductive hearing loss, unilateral, left ear with restricted hearing on the contralateral side: Secondary | ICD-10-CM | POA: Diagnosis not present

## 2024-08-13 DIAGNOSIS — I10 Essential (primary) hypertension: Secondary | ICD-10-CM | POA: Diagnosis not present

## 2024-08-13 DIAGNOSIS — E559 Vitamin D deficiency, unspecified: Secondary | ICD-10-CM | POA: Diagnosis not present

## 2024-08-18 DIAGNOSIS — I10 Essential (primary) hypertension: Secondary | ICD-10-CM | POA: Diagnosis not present

## 2024-08-18 DIAGNOSIS — K449 Diaphragmatic hernia without obstruction or gangrene: Secondary | ICD-10-CM | POA: Diagnosis not present

## 2024-08-18 DIAGNOSIS — E559 Vitamin D deficiency, unspecified: Secondary | ICD-10-CM | POA: Diagnosis not present

## 2024-08-18 DIAGNOSIS — E876 Hypokalemia: Secondary | ICD-10-CM | POA: Diagnosis not present

## 2024-08-18 DIAGNOSIS — M810 Age-related osteoporosis without current pathological fracture: Secondary | ICD-10-CM | POA: Diagnosis not present

## 2024-08-18 DIAGNOSIS — Z8614 Personal history of Methicillin resistant Staphylococcus aureus infection: Secondary | ICD-10-CM | POA: Diagnosis not present

## 2024-08-18 DIAGNOSIS — Z8719 Personal history of other diseases of the digestive system: Secondary | ICD-10-CM | POA: Diagnosis not present

## 2024-08-18 DIAGNOSIS — Z0000 Encounter for general adult medical examination without abnormal findings: Secondary | ICD-10-CM | POA: Diagnosis not present

## 2024-08-18 DIAGNOSIS — Z23 Encounter for immunization: Secondary | ICD-10-CM | POA: Diagnosis not present

## 2024-09-11 DIAGNOSIS — M810 Age-related osteoporosis without current pathological fracture: Secondary | ICD-10-CM | POA: Diagnosis not present

## 2024-09-16 DIAGNOSIS — I10 Essential (primary) hypertension: Secondary | ICD-10-CM | POA: Diagnosis not present

## 2024-09-16 DIAGNOSIS — E876 Hypokalemia: Secondary | ICD-10-CM | POA: Diagnosis not present

## 2024-11-19 ENCOUNTER — Other Ambulatory Visit: Payer: Self-pay | Admitting: Internal Medicine

## 2024-11-19 DIAGNOSIS — Z1231 Encounter for screening mammogram for malignant neoplasm of breast: Secondary | ICD-10-CM

## 2024-12-02 ENCOUNTER — Ambulatory Visit

## 2024-12-16 ENCOUNTER — Ambulatory Visit
